# Patient Record
Sex: Male | Born: 1960 | Race: White | Hispanic: No | Marital: Single | Smoking: Never smoker
Health system: Southern US, Community
[De-identification: ages and names within clinical notes are randomized; demographics above are authoritative.]

## PROBLEM LIST (undated history)

## (undated) DIAGNOSIS — E079 Disorder of thyroid, unspecified: Secondary | ICD-10-CM

## (undated) DIAGNOSIS — E119 Type 2 diabetes mellitus without complications: Secondary | ICD-10-CM

---

## 2002-11-08 ENCOUNTER — Ambulatory Visit (HOSPITAL_BASED_OUTPATIENT_CLINIC_OR_DEPARTMENT_OTHER): Admission: RE | Admit: 2002-11-08 | Discharge: 2002-11-08 | Payer: Self-pay | Admitting: Internal Medicine

## 2004-01-10 ENCOUNTER — Inpatient Hospital Stay: Payer: Self-pay | Admitting: Internal Medicine

## 2004-01-10 ENCOUNTER — Other Ambulatory Visit: Payer: Self-pay

## 2004-05-15 ENCOUNTER — Ambulatory Visit: Payer: Self-pay | Admitting: Unknown Physician Specialty

## 2004-11-16 ENCOUNTER — Ambulatory Visit: Payer: Self-pay | Admitting: Internal Medicine

## 2007-08-29 ENCOUNTER — Ambulatory Visit: Payer: Self-pay | Admitting: Unknown Physician Specialty

## 2007-10-22 ENCOUNTER — Ambulatory Visit: Payer: Self-pay | Admitting: Internal Medicine

## 2011-03-02 ENCOUNTER — Ambulatory Visit: Payer: Self-pay | Admitting: Family Medicine

## 2011-09-08 ENCOUNTER — Emergency Department: Payer: Self-pay | Admitting: Emergency Medicine

## 2012-05-29 ENCOUNTER — Emergency Department: Payer: Self-pay | Admitting: Internal Medicine

## 2012-05-29 LAB — COMPREHENSIVE METABOLIC PANEL
Chloride: 103 mmol/L (ref 98–107)
Co2: 25 mmol/L (ref 21–32)
EGFR (African American): >60
EGFR (Non-African Amer.): >60
Glucose: 183 mg/dL — ABNORMAL HIGH (ref 65–99)
Osmolality: 276 (ref 275–301)
Potassium: 4 mmol/L (ref 3.5–5.1)
SGOT(AST): 19 U/L (ref 15–37)
SGPT (ALT): 39 U/L (ref 12–78)
Total Protein: 7.7 g/dL (ref 6.4–8.2)

## 2012-05-29 LAB — CBC
HCT: 46 % (ref 40.0–52.0)
MCH: 29.6 pg (ref 26.0–34.0)
MCHC: 35.1 g/dL (ref 32.0–36.0)
Platelet: 252 10*3/uL (ref 150–440)
RBC: 5.46 10*6/uL (ref 4.40–5.90)

## 2012-05-29 LAB — DRUG SCREEN, URINE
Amphetamines, Ur Screen: NEGATIVE (ref ?–1000)
MDMA (Ecstasy)Ur Screen: NEGATIVE (ref ?–500)
Methadone, Ur Screen: NEGATIVE (ref ?–300)
Opiate, Ur Screen: NEGATIVE (ref ?–300)

## 2012-08-26 ENCOUNTER — Encounter (HOSPITAL_COMMUNITY): Payer: Self-pay

## 2012-08-26 ENCOUNTER — Emergency Department (HOSPITAL_COMMUNITY)
Admission: EM | Admit: 2012-08-26 | Discharge: 2012-08-26 | Disposition: A | Discharge status: Home / Self Care | Payer: Commercial Managed Care - PPO | Attending: Emergency Medicine | Admitting: Emergency Medicine

## 2012-08-26 DIAGNOSIS — Z209 Contact with and (suspected) exposure to unspecified communicable disease: Secondary | ICD-10-CM

## 2012-08-26 DIAGNOSIS — Z23 Encounter for immunization: Secondary | ICD-10-CM | POA: Insufficient documentation

## 2012-08-26 DIAGNOSIS — E119 Type 2 diabetes mellitus without complications: Secondary | ICD-10-CM | POA: Insufficient documentation

## 2012-08-26 DIAGNOSIS — E079 Disorder of thyroid, unspecified: Secondary | ICD-10-CM | POA: Insufficient documentation

## 2012-08-26 HISTORY — DX: Disorder of thyroid, unspecified: E07.9

## 2012-08-26 HISTORY — DX: Type 2 diabetes mellitus without complications: E11.9

## 2012-08-26 MED ORDER — RABIES VACCINE, PCEC IM SUSR
1.0000 mL | Freq: Once | INTRAMUSCULAR | Status: AC
  Administered 2012-08-26: 1 mL via INTRAMUSCULAR
  Filled 2012-08-26: qty 1

## 2012-08-26 MED ORDER — RABIES IMMUNE GLOBULIN 150 UNIT/ML IM INJ
20.0000 [IU]/kg | INJECTION | Freq: Once | INTRAMUSCULAR | Status: AC
  Administered 2012-08-26: 1650 [IU] via INTRAMUSCULAR
  Filled 2012-08-26: qty 11

## 2012-08-26 NOTE — ED Notes (Signed)
Pt states he was scratched by a bat 3days ago, pt states had a fever x1 but only while at home, states his doctor sent him here to be checked for rabies. 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

## 2012-08-26 NOTE — ED Provider Notes (Signed)
  CSN: [id number]     Arrival date & time 08/26/12  1240 History     First MD Initiated Contact with Patient 08/26/12 1329     Chief Complaint  Patient presents with  . Rabies Injection    check for Rabies   (Consider location/radiation/quality/duration/timing/severity/associated sxs/prior Treatment) HPI Wesley Simon is a 51 y.o. male who presents to ED with complaint of being exposed to bats. States found bats in his addict. States got scratched on right arm. This happened 3 days ago. Unsure if got bit, but scratches did break skin. Also states he came in contact with bat urine and feces. States his PCP told him to come here for further treatment. Pt denies any symptoms.    Past Medical History  Diagnosis Date  . Thyroid disease   . Diabetes mellitus without complication    History reviewed. No pertinent past surgical history. No family history on file. History  Substance Use Topics  . Smoking status: Never Smoker   . Smokeless tobacco: Not on file  . Alcohol Use: No    Review of Systems  Constitutional: Negative for fever and chills.  HENT: Negative for neck pain and neck stiffness.   Respiratory: Negative.   Cardiovascular: Negative.   Musculoskeletal: Negative for myalgias and arthralgias.    Allergies  Other  Home Medications  No current outpatient prescriptions on file. BP 110/74  Pulse 100  Temp(Src) 98.1 F (36.7 C) (Oral)  Resp 16  Ht 5\' 6"  (1.676 m)  Wt 182 lb (82.555 kg)  BMI 29.39 kg/m2  SpO2 95% Physical Exam  Nursing note and vitals reviewed. Constitutional: He is oriented to person, place, and time. He appears well-developed and well-nourished. No distress.  HENT:  Head: Normocephalic.  Eyes: Conjunctivae are normal.  Neck: Neck supple.  Cardiovascular: Normal rate, regular rhythm and normal heart sounds.   Pulmonary/Chest: Effort normal and breath sounds normal. No respiratory distress. He has no wheezes. He has no rales.  Musculoskeletal: He  exhibits no edema.  Neurological: He is alert and oriented to person, place, and time.  Skin: Skin is warm and dry.  No lacerations over arm where pt reported bat scratches    ED Course   Procedures (including critical care time)  Labs Reviewed - No data to display No results found. 1. Exposure to bat without known bite     MDM  Pt with exposure to bats, urine, feces, and open skin to the right arm, which is now healed. Unsure if scratch or bite. Will start both immunoglobulin and vaccine. Instructions given for follow up.     Lottie Mussel, PA-C 08/26/12 2022

## 2012-08-27 NOTE — ED Provider Notes (Signed)
Medical screening examination/treatment/procedure(s) were performed by non-physician practitioner and as supervising physician I was immediately available for consultation/collaboration.   Enid Skeens, MD 08/27/12 [phone]

## 2012-08-30 ENCOUNTER — Emergency Department (INDEPENDENT_AMBULATORY_CARE_PROVIDER_SITE_OTHER)
Admission: EM | Admit: 2012-08-30 | Discharge: 2012-08-30 | Disposition: A | Discharge status: Home / Self Care | Payer: Commercial Managed Care - PPO | Source: Home / Self Care

## 2012-08-30 DIAGNOSIS — Z203 Contact with and (suspected) exposure to rabies: Secondary | ICD-10-CM

## 2012-08-30 MED ORDER — RABIES VACCINE, PCEC IM SUSR
INTRAMUSCULAR | Status: AC
  Filled 2012-08-30: qty 1

## 2012-08-30 MED ORDER — RABIES VACCINE, PCEC IM SUSR
1.0000 mL | Freq: Once | INTRAMUSCULAR | Status: AC
  Administered 2012-08-30: 1 mL via INTRAMUSCULAR

## 2012-08-30 NOTE — ED Notes (Signed)
Patient is here for day 3 rabies vaccination shot due to getting bite by a bat.  The injection is going in L Deltoid.

## 2012-08-30 NOTE — ED Notes (Signed)
States he received first shot in right deltoid.  The documentation from hospital states left deltoid.  Patient states that was not the site.  I will place shot in left deltoid today.

## 2012-09-04 ENCOUNTER — Emergency Department (INDEPENDENT_AMBULATORY_CARE_PROVIDER_SITE_OTHER)
Admission: EM | Admit: 2012-09-04 | Discharge: 2012-09-04 | Disposition: A | Discharge status: Home / Self Care | Payer: Commercial Managed Care - PPO | Source: Home / Self Care

## 2012-09-04 ENCOUNTER — Encounter (HOSPITAL_COMMUNITY): Payer: Self-pay | Admitting: *Deleted

## 2012-09-04 DIAGNOSIS — Z203 Contact with and (suspected) exposure to rabies: Secondary | ICD-10-CM

## 2012-09-04 MED ORDER — RABIES VACCINE, PCEC IM SUSR
INTRAMUSCULAR | Status: AC
  Filled 2012-09-04: qty 1

## 2012-09-04 MED ORDER — RABIES VACCINE, PCEC IM SUSR
1.0000 mL | Freq: Once | INTRAMUSCULAR | Status: AC
  Administered 2012-09-04: 1 mL via INTRAMUSCULAR

## 2012-09-04 NOTE — ED Notes (Signed)
Pt  Here  For  Next  Rabies   Vaccine       -  Pt  States  Everything  Going  Ok   No  Symptoms

## 2012-09-12 ENCOUNTER — Emergency Department (INDEPENDENT_AMBULATORY_CARE_PROVIDER_SITE_OTHER)
Admission: EM | Admit: 2012-09-12 | Discharge: 2012-09-12 | Disposition: A | Discharge status: Home / Self Care | Payer: Commercial Managed Care - PPO | Source: Home / Self Care

## 2012-09-12 ENCOUNTER — Encounter (HOSPITAL_COMMUNITY): Payer: Self-pay

## 2012-09-12 DIAGNOSIS — Z203 Contact with and (suspected) exposure to rabies: Secondary | ICD-10-CM

## 2012-09-12 MED ORDER — RABIES VACCINE, PCEC IM SUSR
1.0000 mL | Freq: Once | INTRAMUSCULAR | Status: AC
  Administered 2012-09-12: 1 mL via INTRAMUSCULAR

## 2012-09-12 MED ORDER — RABIES VACCINE, PCEC IM SUSR
INTRAMUSCULAR | Status: AC
  Filled 2012-09-12: qty 1

## 2012-09-12 NOTE — ED Notes (Signed)
Final shot in rabies series :NAD, no c/o

## 2012-10-16 ENCOUNTER — Emergency Department: Payer: Self-pay | Admitting: Emergency Medicine

## 2012-10-16 LAB — COMPREHENSIVE METABOLIC PANEL
Albumin: 4 g/dL (ref 3.4–5.0)
Alkaline Phosphatase: 75 U/L (ref 50–136)
Anion Gap: 4 — ABNORMAL LOW (ref 7–16)
BUN: 12 mg/dL (ref 7–18)
Bilirubin,Total: 0.5 mg/dL (ref 0.2–1.0)
Chloride: 104 mmol/L (ref 98–107)
Creatinine: 0.99 mg/dL (ref 0.60–1.30)
EGFR (African American): >60
Osmolality: 278 (ref 275–301)

## 2012-10-16 LAB — CBC
HCT: 47.7 % (ref 40.0–52.0)
HGB: 16.8 g/dL (ref 13.0–18.0)
MCHC: 35.2 g/dL (ref 32.0–36.0)
MCV: 85 fL (ref 80–100)
Platelet: 269 10*3/uL (ref 150–440)
RBC: 5.59 10*6/uL (ref 4.40–5.90)
RDW: 13.6 % (ref 11.5–14.5)

## 2012-10-20 ENCOUNTER — Ambulatory Visit: Payer: Self-pay | Admitting: Family Medicine

## 2013-01-27 ENCOUNTER — Ambulatory Visit: Payer: Self-pay | Admitting: Unknown Physician Specialty

## 2013-03-19 ENCOUNTER — Emergency Department: Payer: Self-pay | Admitting: Emergency Medicine

## 2013-03-19 LAB — CBC
HCT: 46.3 % (ref 40.0–52.0)
HGB: 15.9 g/dL (ref 13.0–18.0)
MCH: 29.3 pg (ref 26.0–34.0)
MCHC: 34.3 g/dL (ref 32.0–36.0)
MCV: 85 fL (ref 80–100)
Platelet: 208 10*3/uL (ref 150–440)
RBC: 5.42 10*6/uL (ref 4.40–5.90)
RDW: 14 % (ref 11.5–14.5)
WBC: 6.5 10*3/uL (ref 3.8–10.6)

## 2013-03-19 LAB — URINALYSIS, COMPLETE
BILIRUBIN, UR: NEGATIVE
Bacteria: NONE SEEN
Blood: NEGATIVE
Ketone: NEGATIVE
LEUKOCYTE ESTERASE: NEGATIVE
Nitrite: NEGATIVE
PROTEIN: NEGATIVE
Ph: 5 (ref 4.5–8.0)
RBC,UR: <1 /HPF (ref 0–5)
SQUAMOUS EPITHELIAL: NONE SEEN
Specific Gravity: 1.026 (ref 1.003–1.030)
WBC UR: <1 /HPF (ref 0–5)

## 2013-03-19 LAB — COMPREHENSIVE METABOLIC PANEL
ALT: 43 U/L (ref 12–78)
ANION GAP: 3 — AB (ref 7–16)
AST: 25 U/L (ref 15–37)
Albumin: 4 g/dL (ref 3.4–5.0)
Alkaline Phosphatase: 90 U/L
BILIRUBIN TOTAL: 0.7 mg/dL (ref 0.2–1.0)
BUN: 12 mg/dL (ref 7–18)
CO2: 28 mmol/L (ref 21–32)
CREATININE: 0.97 mg/dL (ref 0.60–1.30)
Calcium, Total: 9.8 mg/dL (ref 8.5–10.1)
Chloride: 98 mmol/L (ref 98–107)
EGFR (African American): >60
EGFR (Non-African Amer.): >60
GLUCOSE: 210 mg/dL — AB (ref 65–99)
Osmolality: 265 (ref 275–301)
POTASSIUM: 4.1 mmol/L (ref 3.5–5.1)
Sodium: 129 mmol/L — ABNORMAL LOW (ref 136–145)
Total Protein: 8 g/dL (ref 6.4–8.2)

## 2013-03-19 LAB — TROPONIN I: Troponin-I: <0.02 ng/mL

## 2013-03-19 LAB — RAPID INFLUENZA A&B ANTIGENS

## 2013-05-14 ENCOUNTER — Ambulatory Visit: Payer: Self-pay | Admitting: Unknown Physician Specialty

## 2013-05-15 LAB — PATHOLOGY REPORT

## 2013-08-03 ENCOUNTER — Emergency Department: Payer: Self-pay | Admitting: Emergency Medicine

## 2013-08-03 LAB — CBC
HCT: 48.9 % (ref 40.0–52.0)
HGB: 16.2 g/dL (ref 13.0–18.0)
MCH: 28.8 pg (ref 26.0–34.0)
MCHC: 33 g/dL (ref 32.0–36.0)
MCV: 87 fL (ref 80–100)
Platelet: 242 10*3/uL (ref 150–440)
RBC: 5.61 10*6/uL (ref 4.40–5.90)
RDW: 13.5 % (ref 11.5–14.5)
WBC: 6.3 10*3/uL (ref 3.8–10.6)

## 2013-08-03 LAB — COMPREHENSIVE METABOLIC PANEL
ALBUMIN: 3.9 g/dL (ref 3.4–5.0)
AST: 31 U/L (ref 15–37)
Alkaline Phosphatase: 78 U/L
Anion Gap: 8 (ref 7–16)
BILIRUBIN TOTAL: 0.6 mg/dL (ref 0.2–1.0)
BUN: 12 mg/dL (ref 7–18)
CALCIUM: 9 mg/dL (ref 8.5–10.1)
CO2: 25 mmol/L (ref 21–32)
Chloride: 103 mmol/L (ref 98–107)
Creatinine: 0.97 mg/dL (ref 0.60–1.30)
EGFR (Non-African Amer.): >60
Glucose: 222 mg/dL — ABNORMAL HIGH (ref 65–99)
OSMOLALITY: 279 (ref 275–301)
Potassium: 3.9 mmol/L (ref 3.5–5.1)
SGPT (ALT): 54 U/L (ref 12–78)
Sodium: 136 mmol/L (ref 136–145)
Total Protein: 7.8 g/dL (ref 6.4–8.2)

## 2013-08-03 LAB — LIPASE, BLOOD: Lipase: 158 U/L (ref 73–393)

## 2013-08-03 LAB — HEPATIC FUNCTION PANEL A (ARMC): BILIRUBIN DIRECT: 0.1 mg/dL (ref 0.00–0.20)

## 2013-12-03 ENCOUNTER — Ambulatory Visit: Payer: Self-pay | Admitting: Unknown Physician Specialty

## 2014-10-11 DIAGNOSIS — J301 Allergic rhinitis due to pollen: Secondary | ICD-10-CM | POA: Insufficient documentation

## 2014-12-03 ENCOUNTER — Emergency Department
Admission: EM | Admit: 2014-12-03 | Discharge: 2014-12-03 | Disposition: A | Discharge status: Home / Self Care | Payer: 59 | Attending: Emergency Medicine | Admitting: Emergency Medicine

## 2014-12-03 DIAGNOSIS — K529 Noninfective gastroenteritis and colitis, unspecified: Secondary | ICD-10-CM | POA: Diagnosis not present

## 2014-12-03 DIAGNOSIS — R112 Nausea with vomiting, unspecified: Secondary | ICD-10-CM | POA: Diagnosis present

## 2014-12-03 DIAGNOSIS — E119 Type 2 diabetes mellitus without complications: Secondary | ICD-10-CM | POA: Diagnosis not present

## 2014-12-03 LAB — BASIC METABOLIC PANEL
ANION GAP: 9 (ref 5–15)
BUN: 20 mg/dL (ref 6–20)
CALCIUM: 9.4 mg/dL (ref 8.9–10.3)
CO2: 25 mmol/L (ref 22–32)
CREATININE: 1.06 mg/dL (ref 0.61–1.24)
Chloride: 101 mmol/L (ref 101–111)
GLUCOSE: 174 mg/dL — AB (ref 65–99)
Potassium: 4 mmol/L (ref 3.5–5.1)
Sodium: 135 mmol/L (ref 135–145)

## 2014-12-03 LAB — CBC
HCT: 52.9 % — ABNORMAL HIGH (ref 40.0–52.0)
HEMOGLOBIN: 17.9 g/dL (ref 13.0–18.0)
MCH: 29.1 pg (ref 26.0–34.0)
MCHC: 33.7 g/dL (ref 32.0–36.0)
MCV: 86.2 fL (ref 80.0–100.0)
PLATELETS: 245 10*3/uL (ref 150–440)
RBC: 6.14 MIL/uL — AB (ref 4.40–5.90)
RDW: 14.1 % (ref 11.5–14.5)
WBC: 10.2 10*3/uL (ref 3.8–10.6)

## 2014-12-03 MED ORDER — ONDANSETRON HCL 4 MG PO TABS: 4.0000 mg | ORAL_TABLET | Freq: Every day | ORAL | Status: DC | PRN

## 2014-12-03 MED ORDER — SODIUM CHLORIDE 0.9 % IV SOLN
1000.0000 mL | Freq: Once | INTRAVENOUS | Status: AC
  Administered 2014-12-03: 1000 mL via INTRAVENOUS

## 2014-12-03 MED ORDER — ONDANSETRON HCL 4 MG/2ML IJ SOLN
INTRAMUSCULAR | Status: AC
  Administered 2014-12-03: 4 mg via INTRAVENOUS
  Filled 2014-12-03: qty 2

## 2014-12-03 MED ORDER — ONDANSETRON HCL 4 MG/2ML IJ SOLN: 4.0000 mg | Freq: Once | INTRAMUSCULAR | Status: AC

## 2014-12-03 NOTE — ED Notes (Signed)
Pt reports n/v/d x 2-3 days.  Pt reports seen at urgent care and sent to ED for dehydration and high blood pressure (159/90), pt states he normally has BP in 120s.  Pt NAD at this time.

## 2014-12-03 NOTE — ED Notes (Signed)
Pt brought to triage via wheelchair. Pt reports he was seen at urgent care and told to come to the ER because of his elevated blood pressure. Pt reports started 3 days ago wit n/v/d and also reports he had a fever two days ago of a little over 100. Pt has been feeling dizzy.

## 2014-12-03 NOTE — ED Provider Notes (Signed)
[facility] Emergency Department Provider Note  ____________________________________________  Time seen: On arrival  I have reviewed the triage vital signs and the nursing notes.   HISTORY  Chief Complaint Dizziness; Nausea; Emesis; and Diarrhea    HPI Wesley Simon is a 54 y.o. male who presents with complaints of nausea vomiting diarrhea for approximately 2 days. He was seen in urgent care and he was sent to the emergency department because he had an episode of dizziness. He also notes his blood pressure was higher than normal. He denies chest pain. He reports his dizziness has resolved. His primary complaint is nausea vomiting and diarrhea which has been copious. He denies abdominal pain     Past Medical History  Diagnosis Date  . Thyroid disease   . Diabetes mellitus without complication     There are no active problems to display for this patient.   No past surgical history on file.  No current outpatient prescriptions on file.  Allergies Other  No family history on file.  Social History Social History  Substance Use Topics  . Smoking status: Never Smoker   . Smokeless tobacco: Not on file  . Alcohol Use: No    Review of Systems  Constitutional: Negative for fever. Eyes: Negative for visual changes. ENT: Negative for sore throat Cardiovascular: Negative for chest pain. Respiratory: Negative for shortness of breath. Gastrointestinal positive for nausea vomiting and diarrhea Genitourinary: Negative for dysuria. Musculoskeletal: Negative for back pain. Skin: Negative for rash. Neurological: Negative for headaches or focal weakness Psychiatric no anxiety    ____________________________________________   PHYSICAL EXAM:  VITAL SIGNS: ED Triage Vitals  Enc Vitals Group     BP 12/03/14 2021 150/99 mmHg     Pulse Rate 12/03/14 2021 98     Resp 12/03/14 2021 18     Temp 12/03/14 2021 97.5 F (36.4 C)     Temp Source  12/03/14 2021 Oral     SpO2 12/03/14 2021 96 %     Weight 12/03/14 2021 185 lb (83.915 kg)     Height 12/03/14 2021 5\' 6"  (1.676 m)     Head Cir --      Peak Flow --      Pain Score 12/03/14 2022 3     Pain Loc --      Pain Edu? --      Excl. in [place]? --      Constitutional: Alert and oriented. Well appearing and in no distress. Eyes: Conjunctivae are normal.  ENT   Head: Normocephalic and atraumatic.   Mouth/Throat: Mucous membranes are moist. Cardiovascular: Normal rate, regular rhythm. Normal and symmetric distal pulses are present in all extremities. No murmurs, rubs, or gallops. Respiratory: Normal respiratory effort without tachypnea nor retractions. Breath sounds are clear and equal bilaterally.  Gastrointestinal: Soft and non-tender in all quadrants. No distention. There is no CVA tenderness. Genitourinary: deferred Musculoskeletal: Nontender with normal range of motion in all extremities. No lower extremity tenderness nor edema. Neurologic:  Normal speech and language. No gross focal neurologic deficits are appreciated. Skin:  Skin is warm, dry and intact. No rash noted. Psychiatric: Mood and affect are normal. Patient exhibits appropriate insight and judgment.  ____________________________________________    LABS (pertinent positives/negatives)  Labs Reviewed  BASIC METABOLIC PANEL - Abnormal; Notable for the following:    Glucose, Bld 174 (*)    All other components within normal limits  CBC - Abnormal; Notable for the following:    RBC  6.14 (*)    HCT 52.9 (*)    All other components within normal limits  URINALYSIS COMPLETEWITH MICROSCOPIC ([facility] ONLY)    ____________________________________________   EKG  ED ECG REPORT I, Lavonia Drafts, the attending physician, personally viewed and interpreted this ECG.  Date: 12/03/2014 EKG Time: 8:22 PM Rate: 99 Rhythm: normal sinus rhythm QRS Axis: normal Intervals: normal ST/T Wave abnormalities:  normal Conduction Disutrbances: none Narrative Interpretation: unremarkable   ____________________________________________    RADIOLOGY I have personally reviewed any xrays that were ordered on this patient: None  ____________________________________________   PROCEDURES  Procedure(s) performed: none  Critical Care performed: none  ____________________________________________   INITIAL IMPRESSION / ASSESSMENT AND PLAN / ED COURSE  Pertinent labs & imaging results that were available during my care of the patient were reviewed by me and considered in my medical decision making (see chart for details).  Patient overall well-appearing. He has no tenderness to palpation of his abdomen. His blood pressure is mildly elevated but is not surprising given that he is feeling ill. We will treat with IV fluids, Zofran and reevaluate.  Patient reports he is tolerating liquids at this time and feeling significant better   We will discharge the patient with Zofran Rx with instructions to return if any worsening of symptoms  ____________________________________________   FINAL CLINICAL IMPRESSION(S) / ED DIAGNOSES  Final diagnoses:  Gastroenteritis     Lavonia Drafts, MD 12/03/14 2249

## 2015-04-30 DIAGNOSIS — D132 Benign neoplasm of duodenum: Secondary | ICD-10-CM | POA: Insufficient documentation

## 2015-09-15 DIAGNOSIS — E786 Lipoprotein deficiency: Secondary | ICD-10-CM | POA: Insufficient documentation

## 2016-12-12 DIAGNOSIS — G4733 Obstructive sleep apnea (adult) (pediatric): Secondary | ICD-10-CM | POA: Insufficient documentation

## 2017-05-17 ENCOUNTER — Ambulatory Visit: Payer: Commercial Managed Care - PPO | Admitting: Dietician

## 2017-05-31 ENCOUNTER — Encounter: Payer: Self-pay | Admitting: Dietician

## 2017-05-31 ENCOUNTER — Encounter: Payer: Managed Care, Other (non HMO) | Attending: Internal Medicine | Admitting: Dietician

## 2017-05-31 VITALS — Ht 66.5 in | Wt 174.0 lb

## 2017-05-31 DIAGNOSIS — Z713 Dietary counseling and surveillance: Secondary | ICD-10-CM | POA: Diagnosis not present

## 2017-05-31 DIAGNOSIS — E119 Type 2 diabetes mellitus without complications: Secondary | ICD-10-CM | POA: Insufficient documentation

## 2017-05-31 NOTE — Patient Instructions (Signed)
   Remember that Carbohydrate foods (starches, fruits, sweets, milk) are the foods that will increase blood sugar if you eat too much at any one meal or snack.   Keep portions of starchy foods to 1 cup or less, which is the same size as a fisted hand.   Eat large portions of low-carb vegetables such as salads, green beans,  asparagus, cabbage, etc.  Plan to eat something every 3-5 hours during the day, either a meal or snack, to help prevent low blood sugars, especially when driving.   Keep up plenty of walking to prevent high blood sugar.

## 2017-05-31 NOTE — Progress Notes (Signed)
Medical Nutrition Therapy: Visit start time: 1330  end time: 7106 Assessment:  Diagnosis: Type 2 diabetes Past medical history: GERD, hyperlipidemia, kidney stones Psychosocial issues/ stress concerns: none  Preferred learning method:  . Auditory . Hands-on  Current weight: 174lbs (pt reported)  Height: 5'6.5" Medications, supplements: reconciled list in medical record  Progress and evaluation: Patient reports several year history of diabetes, previously well-controlled with HbA1C of 6.8%. He states he did take steroid medication temporarily for respiratory infection. 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   Physical activity: frequent walking per patient  Dietary Intake:  Usual eating pattern includes 2-3 meals and 2-3 snacks per day. Dining out frequency: multiple meals per week due to truck driving.  Breakfast: cereal-- lucky charms, wheaties, shredded wheat with sweetener, honey nut cheerios, captain crunch Snack: same as pm Lunch: usually out-- sandwich with ham and cheese, mayo, lettuce, onion on whole wheat occ at subway; occasional steak meal Snack: peanut butter crackers, occasionally cookie(s); often nuts walnuts, almonds, chestnuts, [place] nuts, pistachios, sunflower seeds Supper: lite meal at least 4hours before going to sleep; sandwich; soup Snack: none Beverages: water, coke or pepsi zero sugar, unsweetened tea, light juice 8oz or less with breakfast  Nutrition Care Education: Topics covered: diabetes Basic nutrition: basic food groups, appropriate nutrient balance, appropriate meal and snack schedule, general nutrition guidelines    Diabetes: appropriate meal and snack schedule, appropriate carb intake and balance, effects of different foods on BG control, plate method meal planning Hyperlipidemia: healthy and unhealthy fats   Nutritional Diagnosis:  [place]-2.2 Altered  nutrition-related laboratory As related to diabetes.  As evidenced by patient with HbA1C of 8.5%.  Intervention: Instruction as noted above.   Patient agrees to keep a food diary for evaluation at next visit.    Set goals with input from patient  Education Materials given:  . Plate Planner . Sample menus . Goals/ instructions  Learner/ who was taught:  . Patient   Level of understanding: . Partial understanding; needs review/ practice  Demonstrated degree of understanding via:   Teach back Learning barriers: . None  Willingness to learn/ readiness for change: . Acceptance, ready for change  Monitoring and Evaluation:  Dietary intake, exercise, BG control, and body weight      follow up: 06/12/17

## 2017-06-12 ENCOUNTER — Encounter: Payer: Self-pay | Admitting: Dietician

## 2017-06-12 ENCOUNTER — Encounter: Payer: Managed Care, Other (non HMO) | Admitting: Dietician

## 2017-06-12 VITALS — Ht 66.5 in | Wt 180.4 lb

## 2017-06-12 DIAGNOSIS — E119 Type 2 diabetes mellitus without complications: Secondary | ICD-10-CM

## 2017-06-12 DIAGNOSIS — Z713 Dietary counseling and surveillance: Secondary | ICD-10-CM | POA: Diagnosis not present

## 2017-06-12 NOTE — Patient Instructions (Addendum)
   When you check food labels, read the Total Carbohydrate grams, and keep to less than 60 grams for each meal. You can subtract the dietary fiber from the Total Carbohydrate. Remember that the numbers are based on the serving size listed at the top of the label.  Work to include at least one vegetable daily such as carrots, salad, green beans.   Keep working to increase exercise by adding more days or more time as you are able.

## 2017-06-12 NOTE — Progress Notes (Signed)
Medical Nutrition Therapy: Visit start time: 1130  end time: 1230  Assessment:  Diagnosis: Type 2 Diabetes Medical history changes: no changes Psychosocial issues/ stress concerns: none  Current weight: 180.4lbs Height: 5'6.5" Medications, supplement changes: reconciled list in medical record  Progress and evaluation: Patient reports some weight loss, weight listed at previous RD visit was 174, but was patient reported. Today's weight was measured. 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  Physical activity: increased walking up to 2 miles at a time, 2-3 times a week  Dietary Intake:  Usual eating pattern includes 2-3 meals and 1-2 snacks per day. Dining out frequency: 9-10 meals per week.  Breakfast: cereal with 2% milk with almonds Snack: sometimes crackers with peanut butter Lunch: sandwich, tacos, burger, crackers with cheese and/or peanut butter, nuts Snack: sometimes nuts or crackers with cheese or peanut butter, or popcorn Supper: chicken with rice, beef with rice, sandwich, burger, tacos, hot dog Snack: none or crackers with cheese or peanut butter, or nuts Beverages: diet soda, water  Nutrition Care Education: Topics covered: diabetes, weight control Basic nutrition: review of basic food groups, appropriate nutrient balance Weight control: reviewed strategies for portion control, role of exercise Diabetes: appropriate carb intake and balance  Nutritional Diagnosis:  [place]-2.2 Altered nutrition-related laboratory As related to diabetes.  As evidenced by patient with HbA1C of 8.5%.  Intervention: Discussion and review as noted above.   Commended patient for changes made thus far.    Set goals for further improving carb intake and control.  Education Materials given:  . Food lists/ Planning A Balanced Meal . Goals/ instructions   Learner/ who was taught:  . Patient   Level of  understanding: Marland Kitchen Verbalizes/ demonstrates competency   Demonstrated degree of understanding via:   Teach back Learning barriers: . None  Willingness to learn/ readiness for change: . Eager, change in progress  Monitoring and Evaluation:  Dietary intake, exercise, BG control, and body weight      follow up: 07/11/17

## 2017-06-21 ENCOUNTER — Ambulatory Visit: Payer: Self-pay

## 2017-06-21 ENCOUNTER — Ambulatory Visit (INDEPENDENT_AMBULATORY_CARE_PROVIDER_SITE_OTHER): Payer: Managed Care, Other (non HMO) | Admitting: Urology

## 2017-06-21 ENCOUNTER — Encounter: Payer: Self-pay | Admitting: Urology

## 2017-06-21 VITALS — BP 138/80 | HR 97 | Ht 66.0 in | Wt 184.0 lb

## 2017-06-21 DIAGNOSIS — R350 Frequency of micturition: Secondary | ICD-10-CM

## 2017-06-21 DIAGNOSIS — R310 Gross hematuria: Secondary | ICD-10-CM

## 2017-06-21 LAB — URINALYSIS, COMPLETE
BILIRUBIN UA: NEGATIVE
KETONES UA: NEGATIVE
LEUKOCYTES UA: NEGATIVE
NITRITE UA: NEGATIVE
Protein, UA: NEGATIVE
RBC UA: NEGATIVE
SPEC GRAV UA: 1.02 (ref 1.005–1.030)
UUROB: 1 mg/dL (ref 0.2–1.0)
pH, UA: 5 (ref 5.0–7.5)

## 2017-06-21 LAB — MICROSCOPIC EXAMINATION
EPITHELIAL CELLS (NON RENAL): NONE SEEN /HPF (ref 0–10)
RBC, UA: NONE SEEN /hpf (ref 0–2)
WBC UA: NONE SEEN /HPF (ref 0–5)

## 2017-06-21 NOTE — Progress Notes (Signed)
06/21/2017 3:22 PM   Wesley Simon Jul 10, 1960 [id number]  Referring provider: Tracie Harrier, MD [address] [facility] [place], [place] [address]  Chief Complaint  Patient presents with  . Urinary Frequency    HPI: 57 year old male with a prior history of stone disease.  He has seen Dr. Yves Dill approximately 2 years ago.  He states he was recently seen at [facility] clinic walk-in for blood in the urine and was passing stones.  He apparently had a CT scan done at the outpatient facility on [address] however there is no record in epic.  He is presently asymptomatic and denies flank pain or hematuria.  He did have gross hematuria however states this was in conjunction with passing a stone.    He has baseline urinary frequency, urgency, intermittent urinary stream but states the symptoms are not bothersome enough that he desires medication or treatment.  He also complains of decreased libido.   PMH: Past Medical History:  Diagnosis Date  . Diabetes mellitus without complication ([place])   . Thyroid disease     Surgical History: No past surgical history on file.  Home Medications:  Allergies as of 06/21/2017      Reactions   Other    Pt states "all antibotics"    Tramadol-acetaminophen Rash      Medication List        Accurate as of 06/21/17  3:22 PM. Always use your most recent med list.          atorvastatin 10 MG tablet Commonly known as:  LIPITOR Take by mouth.   glipiZIDE 5 MG 24 hr tablet Commonly known as:  GLUCOTROL XL   glucose blood test strip Use once daily. Use as instructed.   ibuprofen 800 MG tablet Commonly known as:  ADVIL,MOTRIN Take by mouth.   loratadine 10 MG tablet Commonly known as:  CLARITIN take 1 tablet by mouth once daily for allergies   metFORMIN 500 MG 24 hr tablet Commonly known as:  GLUCOPHAGE-XR 2 (two) times daily.       Allergies:  Allergies  Allergen Reactions  . Other     Pt states "all  antibotics"   . Tramadol-Acetaminophen Rash    Family History: No family history on file.  Social History:  reports that he has never smoked. He has never used smokeless tobacco. He reports that he does not drink alcohol or use drugs.  ROS: UROLOGY Frequent Urination?: Yes Hard to postpone urination?: No Burning/pain with urination?: Yes Get up at night to urinate?: Yes Leakage of urine?: Yes Urine stream starts and stops?: Yes Trouble starting stream?: Yes Do you have to strain to urinate?: No Blood in urine?: Yes Urinary tract infection?: No Sexually transmitted disease?: No Injury to kidneys or bladder?: No Painful intercourse?: No Weak stream?: No Erection problems?: Yes Penile pain?: Yes  Gastrointestinal Nausea?: No Vomiting?: No Indigestion/heartburn?: No Diarrhea?: No Constipation?: No  Constitutional Fever: No Night sweats?: No Weight loss?: Yes Fatigue?: Yes  Skin Skin rash/lesions?: No Itching?: No  Eyes Blurred vision?: No Double vision?: No  Ears/Nose/Throat Sore throat?: No Sinus problems?: Yes  Hematologic/Lymphatic Swollen glands?: No Easy bruising?: No  Cardiovascular Leg swelling?: No Chest pain?: No  Respiratory Cough?: No Shortness of breath?: No  Endocrine Excessive thirst?: Yes  Musculoskeletal Back pain?: No Joint pain?: No  Neurological Headaches?: No Dizziness?: No  Psychologic Depression?: No Anxiety?: Yes  Physical Exam: BP 138/80   Pulse 97   Ht 5'  6" (1.676 m)   Wt 184 lb (83.5 kg)   BMI 29.70 kg/m   Constitutional:  Alert and oriented, No acute distress. HEENT: [place] AT, moist mucus membranes.  Trachea midline, no masses. Cardiovascular: No clubbing, cyanosis, or edema. Respiratory: Normal respiratory effort, no increased work of breathing. GI: Abdomen is soft, nontender, nondistended, no abdominal masses GU: No CVA tenderness Lymph: No cervical or inguinal lymphadenopathy. Skin: No rashes,  bruises or suspicious lesions. Neurologic: Grossly intact, no focal deficits, moving all 4 extremities. Psychiatric: Normal mood and affect.   Assessment & Plan:   57 year old male with a history of hematuria and stone disease with an apparent recent CT scan within last 3 months.  Will need to see if a scan was indeed performed at [facility] or elsewhere.  Since he had gross hematuria I did recommend cystoscopy.  Will need to check a a.m. testosterone level for his decreased libido.   Abbie Sons, [place] [address] [place],  [address] [phone]

## 2017-06-25 ENCOUNTER — Encounter: Payer: Self-pay | Admitting: Urology

## 2017-07-11 ENCOUNTER — Encounter: Payer: Self-pay | Admitting: Dietician

## 2017-07-11 ENCOUNTER — Encounter: Payer: Managed Care, Other (non HMO) | Attending: Internal Medicine | Admitting: Dietician

## 2017-07-11 VITALS — Ht 66.5 in | Wt 184.6 lb

## 2017-07-11 DIAGNOSIS — Z713 Dietary counseling and surveillance: Secondary | ICD-10-CM | POA: Insufficient documentation

## 2017-07-11 DIAGNOSIS — E119 Type 2 diabetes mellitus without complications: Secondary | ICD-10-CM | POA: Diagnosis not present

## 2017-07-11 NOTE — Progress Notes (Signed)
Medical Nutrition Therapy: Visit start time: 1100  end time: 1200  Assessment:  Diagnosis: Type 2 Diabetes Medical history changes: patient states he has bronchitis (has not yet seen a physician) Psychosocial issues/ stress concerns: none  Current weight: 184.6lbs Height: 5'6.5" Medications, supplement changes: reconciled list in medical record  Progress and evaluation: Weight gain of 4.2lbs since previous visit on 06/12/17. Patient reports he has not been following his diet as closely recently; but he states he has started eating carrots and asparagus, also green beans. He reports erratic schedule for eating and sleeping recently due to work, ie arrived home at 5am this morning. 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 Patient did not bring food diary to this visit, which was a goal set at previous visit.    Physical activity: walking 2-3 times a week, sporadic recently due to work hours.   Dietary Intake:  Usual eating pattern includes 2 meals and 2-3 snacks per day. Dining out frequency: 6 meals per week.  Breakfast: rice krispies or wheaties with splenda (was eating sugar sweetened cereals) Snack: none or crackers with cheese or pb Lunch: sometimes skips due to lack of time; or snack  Snack: crackers with cheese or peanut butter Supper: time varies; eats 2 meals daily; smaller portions of rice with chicken or beef; or sandwich/ burger Snack: none Beverages: water, unsweetened tea, diet soda. Light juice  Nutrition Care Education: Topics covered: diabetes, weight control Basic nutrition: appropriate meal and snack schedule    Weight control: importance of low fat and low sugar choices, appropriate food portions, role of exercise in weight control and BG control Diabetes:  goals for BGs, appropriate meal and snack schedule, appropriate carb intake and balance, role of protein in BG control, effects of sleep disruption/ erratic schedule on BGs.    Nutritional Diagnosis:  [place]-2.2 Altered  nutrition-related laboratory As related to Type 2 diabetes.  As evidenced by patient with HbA1C of 7.5%. [place]-3.3 Overweight/obesity As related to inactivity and occasional excess calories.  As evidenced by patient report, with BMI of 29.  Intervention: Instruction as noted above.   Updated goals with direction from patient.    Patient would like one more follow-up visit.   Education Materials given:  Marland Kitchen Goals/ instructions   Learner/ who was taught:  . Patient   Level of understanding: Mar[place] Kitchen Verbalizes/ demonstrates competency   Demonstrated degree of understanding via:   Teach back Learning barriers: . None  Willingness to learn/ readiness for change: . Eager, change in progress  Monitoring and Evaluation:  Dietary intake, exercise, BG control, and body weight      follow up: 08/09/17

## 2017-07-11 NOTE — Patient Instructions (Signed)
   Keep working to eat plenty of low-carb vegetables, like salads, carrots, green beans, asparagus.   When eating a salad, try keeping the dressing on the side and dip fork in the dressing, then take a bite of salad.   Continue to include as much exercise as possible by walking often or doing other activities.   Try eating fruit for at least one snack daily with up to 1/4 cup (1/2 of a handful)

## 2017-07-24 ENCOUNTER — Other Ambulatory Visit: Payer: Managed Care, Other (non HMO) | Admitting: Urology

## 2017-07-24 ENCOUNTER — Encounter: Payer: Self-pay | Admitting: Urology

## 2017-07-24 ENCOUNTER — Ambulatory Visit: Payer: Managed Care, Other (non HMO) | Admitting: Urology

## 2017-07-24 VITALS — BP 136/83 | HR 80 | Ht 66.0 in | Wt 175.0 lb

## 2017-07-24 DIAGNOSIS — R310 Gross hematuria: Secondary | ICD-10-CM

## 2017-07-24 DIAGNOSIS — R350 Frequency of micturition: Secondary | ICD-10-CM

## 2017-07-24 DIAGNOSIS — R6882 Decreased libido: Secondary | ICD-10-CM

## 2017-07-24 MED ORDER — CIPROFLOXACIN HCL 500 MG PO TABS: 500.0000 mg | ORAL_TABLET | Freq: Once | ORAL | Status: DC

## 2017-07-24 MED ORDER — LIDOCAINE HCL URETHRAL/MUCOSAL 2 % EX GEL: 1.0000 "application " | Freq: Once | CUTANEOUS | Status: DC

## 2017-07-24 NOTE — Progress Notes (Signed)
   07/24/17  CC:  Chief Complaint  Patient presents with  . Cysto    HPI: Refer to my prior note of 06/21/2017.  He denies recurrent hematuria or passing stones.  There is no record of a CT being performed within the [facility] system.  Blood pressure 136/83, pulse 80, height 5\' 6"  (1.676 m), weight 175 lb (79.4 kg). NED. A&Ox3.   No respiratory distress   Abd soft, NT, ND Normal phallus with bilateral descended testicles  Cystoscopy Procedure Note  Patient identification was confirmed, informed consent was obtained, and patient was prepped using Betadine solution.  Lidocaine jelly was administered per urethral meatus.    Preoperative abx where received prior to procedure.     Pre-Procedure: - Inspection reveals a normal caliber ureteral meatus.  Procedure: The flexible cystoscope was introduced without difficulty - No urethral strictures/lesions are present. - Touching lateral lobes prostate  - Mild bladder neck elevation - Bilateral ureteral orifices identified with clear efflux - Bladder mucosa  reveals no ulcers, tumors, or lesions - No bladder stones - No trabeculation  Retroflexion shows no significant abnormalities   Post-Procedure: - Patient tolerated the procedure well  Assessment/ Plan: Will schedule a renal ultrasound.  If he has recurrent gross hematuria would recommend a CT urogram.  He also is complaining of decreased libido and will have him come back for a morning testosterone level and LH.

## 2017-07-25 LAB — URINALYSIS, COMPLETE
BILIRUBIN UA: NEGATIVE
Ketones, UA: NEGATIVE
LEUKOCYTES UA: NEGATIVE
Nitrite, UA: NEGATIVE
PH UA: 5.5 (ref 5.0–7.5)
RBC, UA: NEGATIVE
Specific Gravity, UA: 1.025 (ref 1.005–1.030)
Urobilinogen, Ur: 0.2 mg/dL (ref 0.2–1.0)

## 2017-07-25 LAB — MICROSCOPIC EXAMINATION
BACTERIA UA: NONE SEEN
Epithelial Cells (non renal): NONE SEEN /hpf (ref 0–10)
WBC, UA: NONE SEEN /hpf (ref 0–5)

## 2017-08-09 ENCOUNTER — Ambulatory Visit: Payer: Managed Care, Other (non HMO)

## 2017-08-09 ENCOUNTER — Other Ambulatory Visit: Payer: Managed Care, Other (non HMO)

## 2017-08-09 ENCOUNTER — Encounter: Payer: Self-pay | Admitting: Dietician

## 2017-08-09 ENCOUNTER — Encounter: Payer: Managed Care, Other (non HMO) | Attending: Internal Medicine | Admitting: Dietician

## 2017-08-09 VITALS — Ht 66.5 in | Wt 184.3 lb

## 2017-08-09 DIAGNOSIS — E119 Type 2 diabetes mellitus without complications: Secondary | ICD-10-CM | POA: Diagnosis not present

## 2017-08-09 DIAGNOSIS — Z713 Dietary counseling and surveillance: Secondary | ICD-10-CM | POA: Diagnosis present

## 2017-08-09 DIAGNOSIS — R6882 Decreased libido: Secondary | ICD-10-CM

## 2017-08-09 NOTE — Patient Instructions (Signed)
   If blood sugar drops too low, test to confirm, and if under 70, take 3-4 glucose tablets or 4oz juice or regular soda. Wait 15 minutes for blood sugar to start coming up, then eat a snack or a meal within 30 minutes.   Keep up some exercise or other activity as often as possible to help burn calories.   Great job working on food portions and controlling carb intake, keep it up!

## 2017-08-09 NOTE — Progress Notes (Signed)
Medical Nutrition Therapy: Visit start time: 1000  end time: 1100  Assessment:  Diagnosis: Type 2 diabetes Medical history changes: no changes Psychosocial issues/ stress concerns: none  Current weight: 184.3lbs Height: 5'6.5" Medications, supplement changes: reconciled list in medical record  Progress and evaluation: Patient reports difficulty with fluctuating work hours and shifts, some 3rd shift, 1st shift, occasional 2nd.  He feels he has lost some weight by eating smaller portions, fewer truck stop meals, orders senior citizen plate when able. He reports decrease in blood sugars, has had occasional low BGs, in 50s per patient.  Physical activity: walking 30-60 minutes when able on days off  Dietary Intake:  Usual eating pattern includes 2 meals and 2 snacks per day. Dining out frequency: 6 meals per week.  Breakfast: wheaties or rice krispies, occ captain crunch Snack: crackers with peanut butter, or cheese Lunch: 6-inch sub with [place], ham or roast beef, sometimes chips  Snack: peanuts, cashews, pistachios, sunflower seeds.  Supper: chicken noodle soup, crackers with peanut butter Snack: sometimes ice cream in afternoon or evening Beverages: water. unsweet tea, diet soda  Nutrition Care Education: Topics covered: diabetes, weight control   Weight control: reviewed food portions, importance of lowfat and low sugar food choices' reviewed role of exercise Diabetes: treatment for hypoglycemia   Nutritional Diagnosis:  [place]-2.2 Altered nutrition-related laboratory As related to Type 2 diabetes.  As evidenced by patient with HbA1C of 7.5%. [place]-3.3 Overweight/obesity As related to inactivity and occasional excess calories, erratic sleep pattern due to work.  As evidenced by patient with BMI of 29.  Intervention: Discussion as noted above.   Updated goals with input from patient; he will continue with current eating pattern and continue to add activity as able.    Patient requested one  more follow-up visit.  Education Materials given:  Marland Kitchen Goals/ instructions   Learner/ who was taught:  . Patient   Level of understanding: Marland Kitchen Verbalizes/ demonstrates competency   Demonstrated degree of understanding via:   Teach back Learning barriers: . None  Willingness to learn/ readiness for change: . Eager, change in progress  Monitoring and Evaluation:  Dietary intake, exercise, BG control, and body weight      follow up: 09/20/17

## 2017-08-10 LAB — FSH/LH
FSH: 5.6 m[IU]/mL (ref 1.5–12.4)
LH: 6.6 m[IU]/mL (ref 1.7–8.6)

## 2017-08-10 LAB — TESTOSTERONE: Testosterone: 386 ng/dL (ref 264–916)

## 2017-08-12 ENCOUNTER — Telehealth: Payer: Self-pay

## 2017-08-12 NOTE — Telephone Encounter (Signed)
Pt notified on vmail

## 2017-08-12 NOTE — Telephone Encounter (Signed)
-----   Message from Abbie Sons, MD sent at 08/11/2017 10:40 AM EDT ----- Testosterone level was normal

## 2017-08-16 ENCOUNTER — Ambulatory Visit: Payer: Managed Care, Other (non HMO)

## 2017-08-28 ENCOUNTER — Other Ambulatory Visit: Payer: Self-pay

## 2017-09-20 ENCOUNTER — Ambulatory Visit
Admission: RE | Admit: 2017-09-20 | Discharge: 2017-09-20 | Disposition: A | Discharge status: Home / Self Care | Payer: Managed Care, Other (non HMO) | Source: Ambulatory Visit | Attending: Urology | Admitting: Urology

## 2017-09-20 ENCOUNTER — Encounter: Payer: Managed Care, Other (non HMO) | Attending: Internal Medicine | Admitting: Dietician

## 2017-09-20 VITALS — Ht 66.5 in | Wt 181.1 lb

## 2017-09-20 DIAGNOSIS — N329 Bladder disorder, unspecified: Secondary | ICD-10-CM | POA: Insufficient documentation

## 2017-09-20 DIAGNOSIS — R310 Gross hematuria: Secondary | ICD-10-CM

## 2017-09-20 DIAGNOSIS — E119 Type 2 diabetes mellitus without complications: Secondary | ICD-10-CM | POA: Diagnosis not present

## 2017-09-20 DIAGNOSIS — R319 Hematuria, unspecified: Secondary | ICD-10-CM | POA: Insufficient documentation

## 2017-09-20 DIAGNOSIS — Z713 Dietary counseling and surveillance: Secondary | ICD-10-CM | POA: Diagnosis not present

## 2017-09-20 NOTE — Progress Notes (Signed)
Medical Nutrition Therapy: Visit start time: 1140  end time: 1210  Assessment:  Diagnosis: type 2 diabetes Medical history changes: none Psychosocial issues/ stress concerns: none  Current weight: 181.1lbs Height: 5'6.5" Medications, supplement changes: reconciled list in medical record.  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  Tests BGs once a day, FBGs ranging 70-90s typically. Had a 55 reading during the night, subsequently patient has stopped Glipizide per MD instructions. Highest reading after eating has been 142.   Physical activity: walking 30-60 minutes on days off. Patient states he has walked more in the past 3 months than he has in a very long time.   Dietary Intake:  Usual eating pattern includes 2-3 meals and 2 snacks per day. Dining out frequency: 6 meals per week.  Breakfast: wheaties or rice krispies, rarely oatmeal with cinnamon and brown sugar.  Snack: small portion crackers with cheese or peanut butter Lunch: sandwich or sub; sometimes skips (does eat snacks if no meal) Snack: small portion nuts Supper: chicken noodle soup, crackers with peanut butter, salads Snack: ice cream only rarely Beverages: water, unsweet tea, occasional diet soda  Nutrition Care Education: Topics covered: diabetes, weight control     Weight control: reviewed progress since previous visit; reviewed importance of regular exercise; appropriate food portions.  Diabetes:  goals for BGs   Nutritional Diagnosis:  [place]-2.2 Altered nutrition-related laboratory As related to diabetes.  As evidenced by patient with recent HbA1C of 7.5%. [place]-3.3 Overweight/obesity As related to history of excess calories and limited activity.  As evidenced by patient with current BMI of 28.7, and patient report of diet history.  Intervention: Discussion as noted above.   Goal is for patient to continue  with current eating pattern and regular exercise.    No further follow-up scheduled at this time; patient will call later if needed.   Education Materials given:  Marland Kitchen Goals/ instructions   Learner/ who was taught:  . Patient    Level of understanding: Marland Kitchen Verbalizes/ demonstrates competency   Demonstrated degree of understanding via:   Teach back Learning barriers: . None  Willingness to learn/ readiness for change: . Eager, change in progress   Monitoring and Evaluation:  Dietary intake, exercise, BG control, and body weight      follow up: prn

## 2017-09-20 NOTE — Patient Instructions (Signed)
   Keep up with your healthy eating pattern and regular exercise, great job!  Call Pam with any questions or concerns.

## 2017-09-23 ENCOUNTER — Telehealth: Payer: Self-pay | Admitting: Family Medicine

## 2017-09-23 NOTE — Telephone Encounter (Signed)
-----   Message from Abbie Sons, MD sent at 09/21/2017  9:14 PM EDT ----- Renal US was normal

## 2017-09-23 NOTE — Telephone Encounter (Signed)
Patient notified, he is wanting to know when he will need to follow up?

## 2017-09-24 NOTE — Telephone Encounter (Signed)
Follow-up Shannon in 6 months for recheck/repeat UA

## 2017-09-26 NOTE — Telephone Encounter (Signed)
Patient is scheduled in September for Elevated PSA. He was referred from PCP

## 2017-10-03 ENCOUNTER — Ambulatory Visit: Payer: Managed Care, Other (non HMO) | Admitting: Urology

## 2017-10-11 ENCOUNTER — Ambulatory Visit: Payer: Managed Care, Other (non HMO) | Admitting: Urology

## 2017-10-18 ENCOUNTER — Ambulatory Visit: Payer: Managed Care, Other (non HMO) | Admitting: Urology

## 2017-10-25 ENCOUNTER — Ambulatory Visit: Payer: Managed Care, Other (non HMO) | Admitting: Urology

## 2017-10-25 ENCOUNTER — Encounter: Payer: Self-pay | Admitting: Urology

## 2017-10-25 VITALS — BP 124/75 | HR 82 | Ht 66.0 in | Wt 180.6 lb

## 2017-10-25 DIAGNOSIS — R972 Elevated prostate specific antigen [PSA]: Secondary | ICD-10-CM

## 2017-10-25 DIAGNOSIS — R6882 Decreased libido: Secondary | ICD-10-CM

## 2017-10-25 MED ORDER — TAMSULOSIN HCL 0.4 MG PO CAPS: 0.4000 mg | ORAL_CAPSULE | Freq: Every day | ORAL | 1 refills | Status: DC

## 2017-10-25 NOTE — Progress Notes (Signed)
10/25/2017 8:46 AM   Wesley Simon 10-11-1960 [id number]  Referring provider: Tracie Harrier, MD [address] [facility] [place], [place] [address]  Chief Complaint  Patient presents with  . Elevated PSA    HPI: 57 year old male presents for evaluation of an elevated PSA.  He has previously been evaluated for hematuria and was last seen June 2019 for a cystoscopy which showed prostate enlargement.  He was also complaining of decreased libido and a testosterone level was normal.  He does have lower urinary tract symptoms including urinary frequency, nocturia x2, urgency, intermittent urinary stream and decreased force and caliber of his stream.  A PSA performed with his PCP on 08/09/2017 was 8.09.  His PSA last 2 years has been in the low to upper 1 range.  His urinalysis was normal   PMH: Past Medical History:  Diagnosis Date  . Diabetes mellitus without complication ([place])   . Thyroid disease     Surgical History: No past surgical history on file.  Home Medications:  Allergies as of 10/25/2017      Reactions   Other    Pt states "all antibotics"    Tramadol-acetaminophen Rash      Medication List        Accurate as of 10/25/17  8:46 AM. Always use your most recent med list.          atorvastatin 10 MG tablet Commonly known as:  LIPITOR Take by mouth.   azithromycin 250 MG tablet Commonly known as:  ZITHROMAX take 2 tablets by mouth today then take 1 tablet DAILY FOR 4 DAYS   benzonatate 100 MG capsule Commonly known as:  TESSALON take 1 capsule by mouth three times a day if needed for cough   glipiZIDE 5 MG 24 hr tablet Commonly known as:  GLUCOTROL XL   glucose blood test strip Use once daily. Use as instructed.   guaiFENesin 600 MG 12 hr tablet Commonly known as:  MUCINEX take 1 tablet by mouth every 12 hours if needed   ibuprofen 800 MG tablet Commonly known as:  ADVIL,MOTRIN Take by mouth.   metFORMIN 500 MG 24 hr  tablet Commonly known as:  GLUCOPHAGE-XR 2 (two) times daily.       Allergies:  Allergies  Allergen Reactions  . Other     Pt states "all antibotics"   . Tramadol-Acetaminophen Rash    Family History: No family history on file.  Social History:  reports that he has never smoked. He has never used smokeless tobacco. He reports that he does not drink alcohol or use drugs.  ROS: UROLOGY Frequent Urination?: Yes Hard to postpone urination?: No Burning/pain with urination?: No Get up at night to urinate?: Yes Leakage of urine?: Yes Urine stream starts and stops?: No Trouble starting stream?: No Do you have to strain to urinate?: No Blood in urine?: Yes Urinary tract infection?: No Sexually transmitted disease?: No Injury to kidneys or bladder?: No Painful intercourse?: No Weak stream?: Yes Erection problems?: Yes Penile pain?: No  Gastrointestinal Nausea?: No Vomiting?: No Indigestion/heartburn?: No Diarrhea?: Yes Constipation?: No  Constitutional Fever: No Night sweats?: Yes Weight loss?: Yes Fatigue?: No  Skin Skin rash/lesions?: No Itching?: No  Eyes Blurred vision?: Yes Double vision?: No  Ears/Nose/Throat Sore throat?: No Sinus problems?: No  Hematologic/Lymphatic Swollen glands?: No Easy bruising?: No  Cardiovascular Leg swelling?: No Chest pain?: No  Respiratory Cough?: No Shortness of breath?: No  Endocrine Excessive thirst?: No  Musculoskeletal  Back pain?: No Joint pain?: No  Neurological Headaches?: No Dizziness?: No  Psychologic Depression?: No Anxiety?: No  Physical Exam: BP 124/75 (BP Location: Left Arm, Patient Position: Sitting, Cuff Size: Normal)   Pulse 82   Ht 5\' 6"  (1.676 m)   Wt 180 lb 9.6 oz (81.9 kg)   BMI 29.15 kg/m   Constitutional:  Alert and oriented, No acute distress. HEENT: [place] AT, moist mucus membranes.  Trachea midline, no masses. Cardiovascular: No clubbing, cyanosis, or edema. Respiratory:  Normal respiratory effort, no increased work of breathing. GI: Abdomen is soft, nontender, nondistended, no abdominal masses GU: No CVA tenderness.  Prostate 35 g, smooth without nodules Lymph: No cervical or inguinal lymphadenopathy. Skin: No rashes, bruises or suspicious lesions. Neurologic: Grossly intact, no focal deficits, moving all 4 extremities. Psychiatric: Normal mood and affect.   Assessment & Plan:   57 year old male with an elevated PSA.  His most recent PSA is significantly elevated above baseline and most likely represents inflammation.  Will treat with a 30-day course of tamsulosin and repeat his PSA in 1 month.  If it remains elevated would recommend prostate biopsy.  Will recheck a total and add a free testosterone with his next lab draw.   Abbie Sons, [place] [address] [place], [place] [address] [phone]

## 2017-11-29 ENCOUNTER — Ambulatory Visit (INDEPENDENT_AMBULATORY_CARE_PROVIDER_SITE_OTHER): Payer: Self-pay | Admitting: Urology

## 2017-11-29 ENCOUNTER — Other Ambulatory Visit: Payer: Managed Care, Other (non HMO)

## 2017-11-29 ENCOUNTER — Encounter: Payer: Self-pay | Admitting: Urology

## 2017-11-29 VITALS — BP 119/67 | HR 82 | Ht 66.0 in | Wt 183.4 lb

## 2017-11-29 DIAGNOSIS — E119 Type 2 diabetes mellitus without complications: Secondary | ICD-10-CM | POA: Insufficient documentation

## 2017-11-29 DIAGNOSIS — N200 Calculus of kidney: Secondary | ICD-10-CM | POA: Insufficient documentation

## 2017-11-29 DIAGNOSIS — R972 Elevated prostate specific antigen [PSA]: Secondary | ICD-10-CM

## 2017-11-29 DIAGNOSIS — K219 Gastro-esophageal reflux disease without esophagitis: Secondary | ICD-10-CM | POA: Insufficient documentation

## 2017-11-29 DIAGNOSIS — R350 Frequency of micturition: Secondary | ICD-10-CM

## 2017-11-29 MED ORDER — ALFUZOSIN HCL ER 10 MG PO TB24: 10.0000 mg | ORAL_TABLET | Freq: Every day | ORAL | 1 refills | Status: DC

## 2017-11-29 NOTE — Progress Notes (Signed)
11/29/2017 9:44 AM   Wesley Simon  [birth year] [id number]  Referring provider: Tracie Harrier, MD [address] [facility] [place], [place] [address]  Chief Complaint  Patient presents with  . Elevated PSA    HPI: Patient was seen 9/27 for an elevated PSA, lower urinary tract symptoms and low libido.  He was given a trial of tamsulosin and was to have a repeat PSA and free testosterone.  He states he had extreme tiredness with the tamsulosin and had to discontinue the medication after few days.  He did not have his blood drawn and states he will have to wait until after the first of the year because his insurance does not take effect until then.  He was interested in an alternative medication for his lower urinary tract symptoms.   PMH: Past Medical History:  Diagnosis Date  . Diabetes mellitus without complication ([place])   . Thyroid disease     Surgical History: No past surgical history on file.  Home Medications:  Allergies as of 11/29/2017      Reactions   Other    Pt states "all antibotics"    Tramadol-acetaminophen Rash      Medication List        Accurate as of 11/29/17  9:44 AM. Always use your most recent med list.          atorvastatin 10 MG tablet Commonly known as:  LIPITOR Take by mouth.   glipiZIDE 5 MG 24 hr tablet Commonly known as:  GLUCOTROL XL   glucose blood test strip Use once daily. Use as instructed.   ibuprofen 800 MG tablet Commonly known as:  ADVIL,MOTRIN Take by mouth.   metFORMIN 500 MG 24 hr tablet Commonly known as:  GLUCOPHAGE-XR 2 (two) times daily.   tamsulosin 0.4 MG Caps capsule Commonly known as:  FLOMAX Take 1 capsule (0.4 mg total) by mouth daily.       Allergies:  Allergies  Allergen Reactions  . Other     Pt states "all antibotics"   . Tramadol-Acetaminophen Rash    Family History: No family history on file.  Social History:  reports that he has never smoked. He has never used  smokeless tobacco. He reports that he does not drink alcohol or use drugs.  ROS: UROLOGY Frequent Urination?: Yes Hard to postpone urination?: No Burning/pain with urination?: No Get up at night to urinate?: Yes Leakage of urine?: No Urine stream starts and stops?: No Trouble starting stream?: No Do you have to strain to urinate?: No Blood in urine?: No Urinary tract infection?: No Sexually transmitted disease?: No Injury to kidneys or bladder?: No Painful intercourse?: No Weak stream?: No Erection problems?: No Penile pain?: No  Gastrointestinal Nausea?: No Vomiting?: No Indigestion/heartburn?: No Diarrhea?: No Constipation?: No  Constitutional Fever: No Night sweats?: Yes Weight loss?: No Fatigue?: No  Skin Skin rash/lesions?: No Itching?: No  Eyes Blurred vision?: No Double vision?: No  Ears/Nose/Throat Sore throat?: No Sinus problems?: Yes  Hematologic/Lymphatic Swollen glands?: No Easy bruising?: No  Cardiovascular Leg swelling?: No Chest pain?: No  Respiratory Cough?: No Shortness of breath?: No  Endocrine Excessive thirst?: No  Musculoskeletal Back pain?: No Joint pain?: No  Neurological Headaches?: No Dizziness?: No  Psychologic Depression?: No Anxiety?: No  Physical Exam: BP 119/67 (BP Location: Left Arm, Patient Position: Sitting, Cuff Size: Large)   Pulse 82   Ht 5\' 6"  (1.676 m)   Wt 183 lb 6.4 oz (83.2 kg)  BMI 29.60 kg/m   Constitutional:  Alert and oriented, No acute distress.   Assessment & Plan:   57 year old male with an elevated PSA and lower urinary tract symptoms.  He was unable to tolerate tamsulosin.  His PSA has not been repeated.    Rx alfuzosin was sent to his pharmacy.  Lab draw will be rescheduled for early January 2020.  Abbie Sons, [place] [address], [place], [place] [address] [phone]

## 2018-02-07 ENCOUNTER — Ambulatory Visit: Payer: Commercial Managed Care - PPO | Admitting: Urology

## 2018-02-07 ENCOUNTER — Encounter: Payer: Self-pay | Admitting: Urology

## 2018-02-07 VITALS — BP 115/71 | HR 81 | Ht 66.0 in | Wt 174.0 lb

## 2018-02-07 DIAGNOSIS — N401 Enlarged prostate with lower urinary tract symptoms: Secondary | ICD-10-CM | POA: Diagnosis not present

## 2018-02-07 DIAGNOSIS — K219 Gastro-esophageal reflux disease without esophagitis: Secondary | ICD-10-CM | POA: Diagnosis not present

## 2018-02-07 DIAGNOSIS — E119 Type 2 diabetes mellitus without complications: Secondary | ICD-10-CM | POA: Diagnosis not present

## 2018-02-07 DIAGNOSIS — R972 Elevated prostate specific antigen [PSA]: Secondary | ICD-10-CM | POA: Diagnosis not present

## 2018-02-07 DIAGNOSIS — J301 Allergic rhinitis due to pollen: Secondary | ICD-10-CM | POA: Diagnosis not present

## 2018-02-07 DIAGNOSIS — R6882 Decreased libido: Secondary | ICD-10-CM | POA: Diagnosis not present

## 2018-02-07 MED ORDER — ALFUZOSIN HCL ER 10 MG PO TB24: 10.0000 mg | ORAL_TABLET | Freq: Every day | ORAL | 3 refills | Status: AC

## 2018-02-07 NOTE — Progress Notes (Signed)
02/07/2018 10:47 AM   Wesley Simon Nov 13, 1960 [id number]  Referring provider: Tracie Harrier, MD [address] [facility] [place], [place] [address]  Chief Complaint  Patient presents with  . Elevated PSA    follow up    HPI: 58 year old male presents for follow-up of an elevated PSA, lower urinary tract symptoms and decreased libido.  He was last seen 11/29/2017.  He had side effects secondary to tamsulosin and was given a trial of alfuzosin.  He states this medication significantly improved his voiding symptoms and he had no side effects.  He desires to continue. He has not had his PSA repeated.  Prior DRE was benign.  PMH: Past Medical History:  Diagnosis Date  . Diabetes mellitus without complication ([place])   . Thyroid disease     Surgical History: No past surgical history on file.  Home Medications:  Allergies as of 02/07/2018      Reactions   Other    Pt states "all antibotics"    Tramadol-acetaminophen Rash      Medication List       Accurate as of February 07, 2018 10:47 AM. Always use your most recent med list.        alfuzosin 10 MG 24 hr tablet Commonly known as:  UROXATRAL Take 1 tablet (10 mg total) by mouth daily with breakfast.   atorvastatin 10 MG tablet Commonly known as:  LIPITOR Take by mouth.   glipiZIDE 5 MG 24 hr tablet Commonly known as:  GLUCOTROL XL   glucose blood test strip Use once daily. Use as instructed.   ibuprofen 800 MG tablet Commonly known as:  ADVIL,MOTRIN Take by mouth.   metFORMIN 500 MG 24 hr tablet Commonly known as:  GLUCOPHAGE-XR 2 (two) times daily.       Allergies:  Allergies  Allergen Reactions  . Other     Pt states "all antibotics"   . Tramadol-Acetaminophen Rash    Family History: No family history on file.  Social History:  reports that he has never smoked. He has never used smokeless tobacco. He reports that he does not drink alcohol or use  drugs.  ROS: UROLOGY Frequent Urination?: No Hard to postpone urination?: No Burning/pain with urination?: No Get up at night to urinate?: Yes Leakage of urine?: No Urine stream starts and stops?: No Trouble starting stream?: No Do you have to strain to urinate?: No Blood in urine?: No Urinary tract infection?: No Sexually transmitted disease?: No Injury to kidneys or bladder?: No Painful intercourse?: No Weak stream?: No Erection problems?: Yes Penile pain?: No  Gastrointestinal Nausea?: No Vomiting?: No Indigestion/heartburn?: No Diarrhea?: Yes Constipation?: No  Constitutional Fever: No Night sweats?: No Weight loss?: No Fatigue?: Yes  Skin Skin rash/lesions?: No Itching?: No  Eyes Blurred vision?: No Double vision?: No  Ears/Nose/Throat Sore throat?: No Sinus problems?: Yes  Hematologic/Lymphatic Swollen glands?: No Easy bruising?: No  Cardiovascular Leg swelling?: No Chest pain?: No  Respiratory Cough?: No Shortness of breath?: No  Endocrine Excessive thirst?: No  Musculoskeletal Back pain?: No Joint pain?: No  Neurological Headaches?: No Dizziness?: No  Psychologic Depression?: No Anxiety?: No  Physical Exam: BP 115/71   Pulse 81   Ht 5\' 6"  (1.676 m)   Wt 174 lb (78.9 kg)   BMI 28.08 kg/m   Constitutional:  Alert and oriented, No acute distress.   Assessment & Plan:    1. Low libido Repeat total testosterone along with a free testosterone  2. Elevated PSA PSA repeated today and if persistently elevated recommend prostate biopsy.  3.  BPH with lower urinary tract symptoms Significant improvement on alfuzosin which was refilled.   Return in about 1 year (around 02/08/2019) for Recheck.   Abbie Sons, [place] [address] [place], [place] [address] [phone]

## 2018-02-08 LAB — TESTOSTERONE,FREE AND TOTAL
Testosterone, Free: 9.7 pg/mL (ref 7.2–24.0)
Testosterone: 286 ng/dL (ref 264–916)

## 2018-02-08 LAB — PSA: PROSTATE SPECIFIC AG, SERUM: 2.3 ng/mL (ref 0.0–4.0)

## 2018-02-10 ENCOUNTER — Telehealth: Payer: Self-pay

## 2018-02-10 NOTE — Telephone Encounter (Signed)
Patient notified and voiced understanding.

## 2018-02-10 NOTE — Telephone Encounter (Signed)
Left message for patient to call office to discuss results

## 2018-02-10 NOTE — Telephone Encounter (Signed)
-----   Message from Abbie Sons, MD sent at 02/09/2018 10:34 AM EST ----- PSA returned to normal at 2.3.  Free and total testosterone levels were within the normal range

## 2018-02-14 DIAGNOSIS — E786 Lipoprotein deficiency: Secondary | ICD-10-CM | POA: Diagnosis not present

## 2018-02-14 DIAGNOSIS — E1165 Type 2 diabetes mellitus with hyperglycemia: Secondary | ICD-10-CM | POA: Diagnosis not present

## 2018-02-14 DIAGNOSIS — Z0000 Encounter for general adult medical examination without abnormal findings: Secondary | ICD-10-CM | POA: Diagnosis not present

## 2018-03-19 DIAGNOSIS — J209 Acute bronchitis, unspecified: Secondary | ICD-10-CM | POA: Diagnosis not present

## 2018-03-19 DIAGNOSIS — R05 Cough: Secondary | ICD-10-CM | POA: Diagnosis not present

## 2018-04-18 DIAGNOSIS — M545 Low back pain: Secondary | ICD-10-CM | POA: Diagnosis not present

## 2018-04-18 DIAGNOSIS — M9903 Segmental and somatic dysfunction of lumbar region: Secondary | ICD-10-CM | POA: Diagnosis not present

## 2018-04-18 DIAGNOSIS — M9901 Segmental and somatic dysfunction of cervical region: Secondary | ICD-10-CM | POA: Diagnosis not present

## 2018-04-25 DIAGNOSIS — M9901 Segmental and somatic dysfunction of cervical region: Secondary | ICD-10-CM | POA: Diagnosis not present

## 2018-04-25 DIAGNOSIS — M545 Low back pain: Secondary | ICD-10-CM | POA: Diagnosis not present

## 2018-04-25 DIAGNOSIS — M9903 Segmental and somatic dysfunction of lumbar region: Secondary | ICD-10-CM | POA: Diagnosis not present

## 2018-05-07 DIAGNOSIS — M9903 Segmental and somatic dysfunction of lumbar region: Secondary | ICD-10-CM | POA: Diagnosis not present

## 2018-05-07 DIAGNOSIS — M9901 Segmental and somatic dysfunction of cervical region: Secondary | ICD-10-CM | POA: Diagnosis not present

## 2018-05-07 DIAGNOSIS — M545 Low back pain: Secondary | ICD-10-CM | POA: Diagnosis not present

## 2018-05-15 DIAGNOSIS — M9903 Segmental and somatic dysfunction of lumbar region: Secondary | ICD-10-CM | POA: Diagnosis not present

## 2018-05-15 DIAGNOSIS — M9901 Segmental and somatic dysfunction of cervical region: Secondary | ICD-10-CM | POA: Diagnosis not present

## 2018-05-15 DIAGNOSIS — M545 Low back pain: Secondary | ICD-10-CM | POA: Diagnosis not present

## 2019-02-09 ENCOUNTER — Ambulatory Visit: Payer: Commercial Managed Care - PPO | Admitting: Urology

## 2020-05-27 ENCOUNTER — Ambulatory Visit
Admission: RE | Admit: 2020-05-27 | Discharge: 2020-05-27 | Disposition: A | Discharge status: Home / Self Care | Payer: Commercial Managed Care - PPO | Source: Ambulatory Visit | Attending: Internal Medicine | Admitting: Internal Medicine

## 2020-05-27 ENCOUNTER — Other Ambulatory Visit: Payer: Self-pay | Admitting: Internal Medicine

## 2020-05-27 ENCOUNTER — Other Ambulatory Visit: Payer: Self-pay

## 2020-05-27 DIAGNOSIS — K920 Hematemesis: Secondary | ICD-10-CM | POA: Diagnosis not present

## 2020-05-27 MED ORDER — IOHEXOL 350 MG/ML SOLN
75.0000 mL | Freq: Once | INTRAVENOUS | Status: AC | PRN
  Administered 2020-05-27: 75 mL via INTRAVENOUS

## 2021-04-11 ENCOUNTER — Other Ambulatory Visit (HOSPITAL_COMMUNITY): Payer: Self-pay | Admitting: Internal Medicine

## 2021-04-11 ENCOUNTER — Other Ambulatory Visit: Payer: Self-pay | Admitting: Internal Medicine

## 2021-04-11 DIAGNOSIS — E1165 Type 2 diabetes mellitus with hyperglycemia: Secondary | ICD-10-CM

## 2021-04-11 DIAGNOSIS — N50811 Right testicular pain: Secondary | ICD-10-CM

## 2021-04-17 ENCOUNTER — Other Ambulatory Visit: Payer: Commercial Managed Care - PPO

## 2021-04-21 ENCOUNTER — Other Ambulatory Visit: Payer: Commercial Managed Care - PPO

## 2021-06-24 ENCOUNTER — Emergency Department: Payer: Commercial Managed Care - PPO

## 2021-06-24 ENCOUNTER — Other Ambulatory Visit: Payer: Self-pay

## 2021-06-24 ENCOUNTER — Emergency Department (HOSPITAL_COMMUNITY): Payer: Commercial Managed Care - PPO

## 2021-06-24 ENCOUNTER — Emergency Department
Admission: EM | Admit: 2021-06-24 | Discharge: 2021-06-24 | Disposition: A | Discharge status: Home / Self Care | Payer: Commercial Managed Care - PPO | Attending: Emergency Medicine | Admitting: Emergency Medicine

## 2021-06-24 DIAGNOSIS — R109 Unspecified abdominal pain: Secondary | ICD-10-CM | POA: Diagnosis present

## 2021-06-24 DIAGNOSIS — R059 Cough, unspecified: Secondary | ICD-10-CM | POA: Insufficient documentation

## 2021-06-24 DIAGNOSIS — E1165 Type 2 diabetes mellitus with hyperglycemia: Secondary | ICD-10-CM | POA: Insufficient documentation

## 2021-06-24 DIAGNOSIS — K529 Noninfective gastroenteritis and colitis, unspecified: Secondary | ICD-10-CM | POA: Diagnosis not present

## 2021-06-24 DIAGNOSIS — R748 Abnormal levels of other serum enzymes: Secondary | ICD-10-CM | POA: Diagnosis not present

## 2021-06-24 DIAGNOSIS — K219 Gastro-esophageal reflux disease without esophagitis: Secondary | ICD-10-CM | POA: Insufficient documentation

## 2021-06-24 LAB — CBC
HCT: 48.5 % (ref 39.0–52.0)
Hemoglobin: 16.3 g/dL (ref 13.0–17.0)
MCH: 28.5 pg (ref 26.0–34.0)
MCHC: 33.6 g/dL (ref 30.0–36.0)
MCV: 84.8 fL (ref 80.0–100.0)
Platelets: 400 10*3/uL (ref 150–400)
RBC: 5.72 MIL/uL (ref 4.22–5.81)
RDW: 12.6 % (ref 11.5–15.5)
WBC: 9.3 10*3/uL (ref 4.0–10.5)
nRBC: 0 % (ref 0.0–0.2)

## 2021-06-24 LAB — URINALYSIS, ROUTINE W REFLEX MICROSCOPIC
Bilirubin Urine: NEGATIVE
Glucose, UA: 150 mg/dL — AB
Hgb urine dipstick: NEGATIVE
Ketones, ur: NEGATIVE mg/dL
Leukocytes,Ua: NEGATIVE
Nitrite: NEGATIVE
Protein, ur: NEGATIVE mg/dL
Specific Gravity, Urine: 1.045 — ABNORMAL HIGH (ref 1.005–1.030)
pH: 5 (ref 5.0–8.0)

## 2021-06-24 LAB — COMPREHENSIVE METABOLIC PANEL
ALT: 30 U/L (ref 0–44)
AST: 20 U/L (ref 15–41)
Albumin: 3.9 g/dL (ref 3.5–5.0)
Alkaline Phosphatase: 61 U/L (ref 38–126)
Anion gap: 9 (ref 5–15)
BUN: 16 mg/dL (ref 6–20)
CO2: 25 mmol/L (ref 22–32)
Calcium: 9.5 mg/dL (ref 8.9–10.3)
Chloride: 101 mmol/L (ref 98–111)
Creatinine, Ser: 1 mg/dL (ref 0.61–1.24)
GFR, Estimated: >60 mL/min (ref >60)
Glucose, Bld: 226 mg/dL — ABNORMAL HIGH (ref 70–99)
Potassium: 4.3 mmol/L (ref 3.5–5.1)
Sodium: 135 mmol/L (ref 135–145)
Total Bilirubin: 1.3 mg/dL — ABNORMAL HIGH (ref 0.3–1.2)
Total Protein: 7 g/dL (ref 6.5–8.1)

## 2021-06-24 LAB — LIPASE, BLOOD: Lipase: 64 U/L — ABNORMAL HIGH (ref 11–51)

## 2021-06-24 MED ORDER — SUCRALFATE 1 GM/10ML PO SUSP: 1.0000 g | Freq: Four times a day (QID) | ORAL | 0 refills | Status: AC | PRN

## 2021-06-24 MED ORDER — PANTOPRAZOLE SODIUM 40 MG PO TBEC: 40.0000 mg | DELAYED_RELEASE_TABLET | Freq: Every day | ORAL | 0 refills | Status: AC

## 2021-06-24 MED ORDER — ONDANSETRON HCL 4 MG/2ML IJ SOLN
4.0000 mg | Freq: Once | INTRAMUSCULAR | Status: AC
  Administered 2021-06-24: 4 mg via INTRAVENOUS
  Filled 2021-06-24: qty 2

## 2021-06-24 MED ORDER — SODIUM CHLORIDE 0.9 % IV BOLUS
1000.0000 mL | Freq: Once | INTRAVENOUS | Status: AC
  Administered 2021-06-24: 1000 mL via INTRAVENOUS

## 2021-06-24 MED ORDER — IOHEXOL 300 MG/ML  SOLN
100.0000 mL | Freq: Once | INTRAMUSCULAR | Status: AC | PRN
  Administered 2021-06-24: 100 mL via INTRAVENOUS

## 2021-06-24 NOTE — ED Notes (Signed)
Pt states he is unable to urinate at this time- pt informed to hit the call bell when he is able to give sample.

## 2021-06-24 NOTE — ED Triage Notes (Signed)
Pt states for the past 2 weeks he has had cough, abd pain, and acid reflux off and on- pt states when he has anything hot he throws it back up

## 2021-06-24 NOTE — ED Provider Notes (Signed)
[facility] Provider Note    Event Date/Time   First MD Initiated Contact with Patient 06/24/21 1500     (approximate)   History   Abdominal Pain   HPI  Wesley Simon is a 61 y.o. male  with history of DM II, GERD, OSA and as listed in EMR presents to the emergency department for evaluation of cough and abdominal pain with vomiting. Cough has been present for the past 2 weeks. He states that his GERD has gotten worse despite taking his medication as prescribed. He denies fever. He last vomited yesterday.      Physical Exam   Triage Vital Signs: ED Triage Vitals  Enc Vitals Group     BP 06/24/21 1333 117/79     Pulse Rate 06/24/21 1333 100     Resp 06/24/21 1333 18     Temp 06/24/21 1333 98.9 F (37.2 C)     Temp Source 06/24/21 1333 Oral     SpO2 06/24/21 1333 97 %     Weight 06/24/21 1333 171 lb (77.6 kg)     Height 06/24/21 1333 '5\' 6"'$  (1.676 m)     Head Circumference --      Peak Flow --      Pain Score 06/24/21 1338 6     Pain Loc --      Pain Edu? --      Excl. in [place]? --     Most recent vital signs: Vitals:   06/24/21 1333 06/24/21 1704  BP: 117/79 121/80  Pulse: 100 77  Resp: 18 18  Temp: 98.9 F (37.2 C)   SpO2: 97% 98%    General: Awake, no distress.  CV:  Good peripheral perfusion.  Resp:  Normal effort.  Abd:  No distention. No focal tenderness. No rebound tenderness or guarding. Other:     ED Results / Procedures / Treatments   Labs (all labs ordered are listed, but only abnormal results are displayed) Labs Reviewed  LIPASE, BLOOD - Abnormal; Notable for the following components:      Result Value   Lipase 64 (*)    All other components within normal limits  COMPREHENSIVE METABOLIC PANEL - Abnormal; Notable for the following components:   Glucose, Bld 226 (*)    Total Bilirubin 1.3 (*)    All other components within normal limits  URINALYSIS, ROUTINE W REFLEX MICROSCOPIC - Abnormal; Notable for the following  components:   Color, Urine YELLOW (*)    APPearance CLEAR (*)    Specific Gravity, Urine 1.045 (*)    Glucose, UA 150 (*)    All other components within normal limits  CBC     EKG  ED ECG REPORT I, Tamu Golz, FNP-BC personally viewed and interpreted this ECG.   Date: 06/24/2021  EKG Time: 1349  Rate: 85  Rhythm: normal EKG, normal sinus rhythm, unchanged from previous tracings  Axis: normal  Intervals:none  ST&T Change: no     RADIOLOGY  Image interpreted and radiology report reviewed by me.  CT abdomen and pelvis shows small hiatal hernia and evidence of enteritis.  PROCEDURES:  Critical Care performed: No  Procedures   MEDICATIONS ORDERED IN ED: Medications  sodium chloride 0.9 % bolus 1,000 mL (0 mLs Intravenous Stopped 06/24/21 1704)  ondansetron (ZOFRAN) injection 4 mg (4 mg Intravenous Given 06/24/21 1541)  iohexol (OMNIPAQUE) 300 MG/ML solution 100 mL (100 mLs Intravenous Contrast Given 06/24/21 1640)     IMPRESSION / MDM /  ASSESSMENT AND PLAN / ED COURSE   I have reviewed the triage note.  Differential diagnosis includes, but is not limited to: bowel obstruction, appendicitis, acute cholelithiasis, enteritis.  61 year old male presenting to the emergency department for treatment and evaluation of abdominal pain with nausea and vomiting.  See HPI for further details.  Labs are overall reassuring.  He has a normal white count, electrolytes with exception of glucose elevated.  Patient is diabetic and has not yet taken his medications.  Urinalysis shows glucosuria.  Lipase is slightly elevated at 64 but it appears that this is a chronic elevation.  CT of the abdomen and pelvis is without acute concerns.  He may possibly have some enteritis which would explain his symptoms.  Plan will be to treat him with Zofran which worked well here in the emergency department.  He will also be given a prescription for Protonix to replace the omeprazole and Carafate to  be taken if needed for GERD esophagitis.  Patient advised to call and schedule follow-up appointment with his gastroenterologist or primary care provider for symptoms that are not improving over the next few days.  He was advised to return to the emergency department for symptoms that change or worsen if he is unable to schedule appointment.      FINAL CLINICAL IMPRESSION(S) / ED DIAGNOSES   Final diagnoses:  Gastroesophageal reflux disease without esophagitis  Enteritis     Rx / DC Orders   ED Discharge Orders          Ordered    pantoprazole (PROTONIX) 40 MG tablet  Daily        06/24/21 1749    sucralfate (CARAFATE) 1 GM/10ML suspension  Every 6 hours PRN        06/24/21 1749             Note:  This document was prepared using Dragon voice recognition software and may include unintentional dictation errors.   Victorino Dike, FNP 06/24/21 Consuello Closs, MD 06/24/21 1902

## 2022-11-08 IMAGING — CT CT ABD-PELV W/ CM
2 of 5 series · 16 of 46 positions shown, 18 images · IV contrast (APPLIED)
Comparison: 10/20/2012 CT stone study

CLINICAL DATA: Bloating with nausea vomiting for 2 years.
Hematemesis 2 weeks ago. History gastroesophageal reflux disease.
Type 2 diabetes.

EXAM:
CT ABDOMEN AND PELVIS WITH CONTRAST
TECHNIQUE: Multidetector CT imaging of the abdomen and pelvis was performed
using the standard protocol following bolus administration of
intravenous contrast.
CONTRAST:  75mL OMNIPAQUE IOHEXOL 350 MG/ML SOLN

[Series 2: routine abd/pel with · axial · 0.76mm/px · z∈[-458,-28]mm · 13 of 98 slices shown, 15 images]
[im 6/98  soft-tissue]
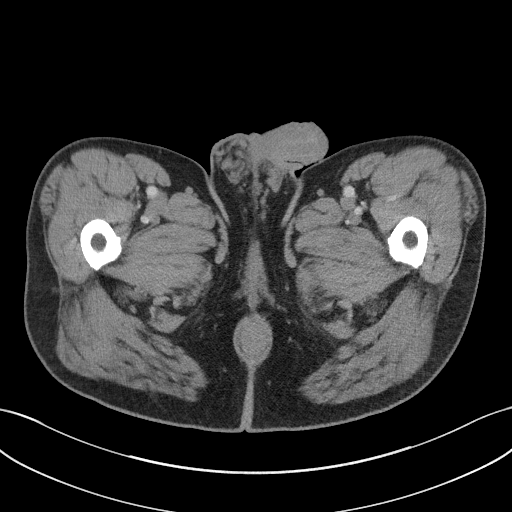
[im 6/98  bone]
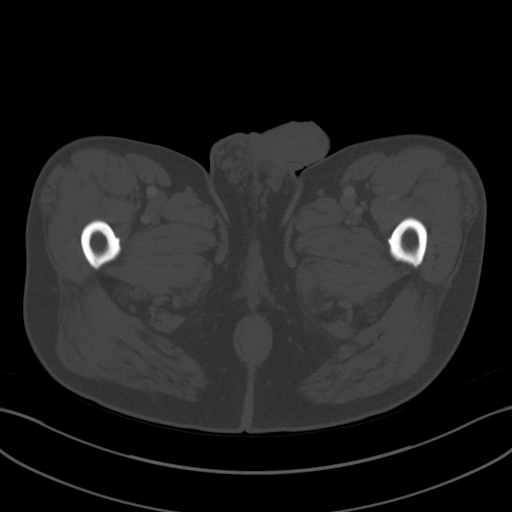
[im 11/98  soft-tissue]
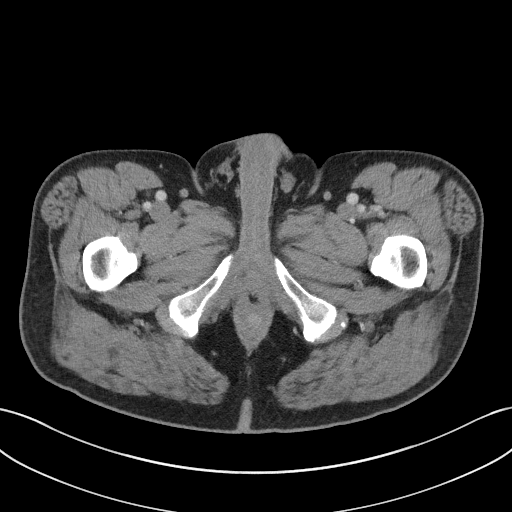
[im 22/98  soft-tissue]
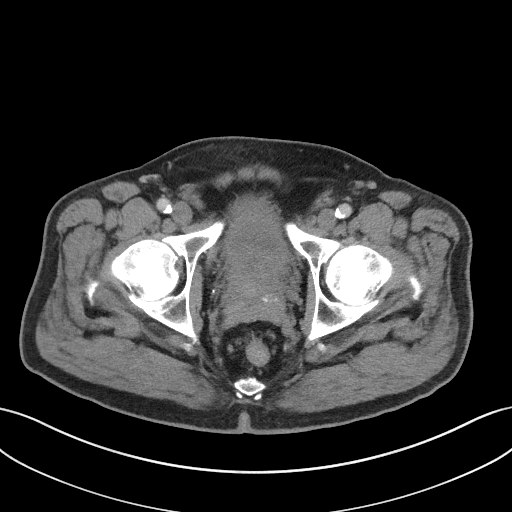
[im 27/98  soft-tissue]
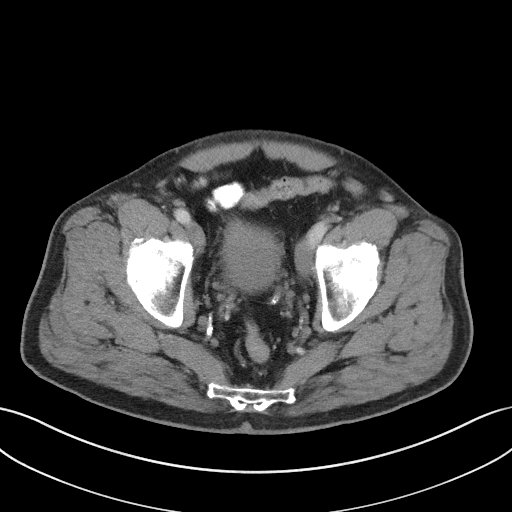
[im 33/98  soft-tissue]
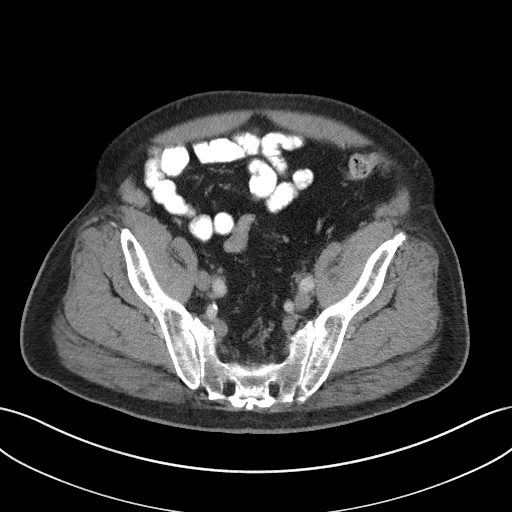
[im 44/98  soft-tissue]
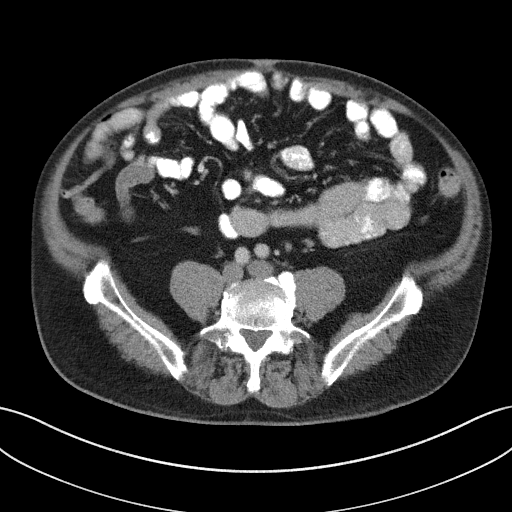
[im 49/98  soft-tissue]
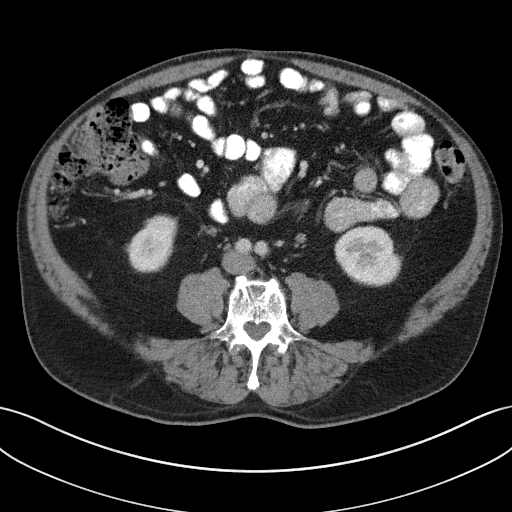
[im 54/98  soft-tissue]
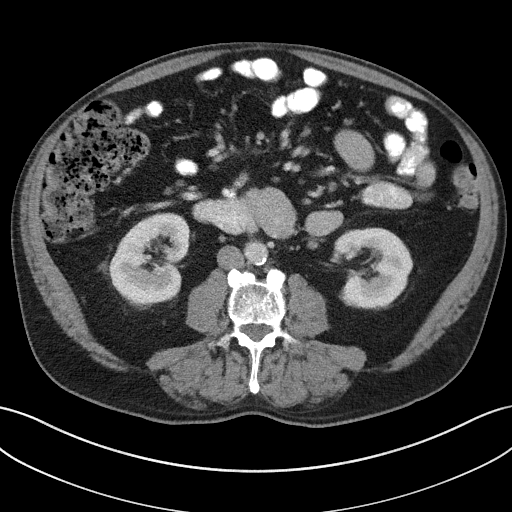
[im 65/98  soft-tissue]
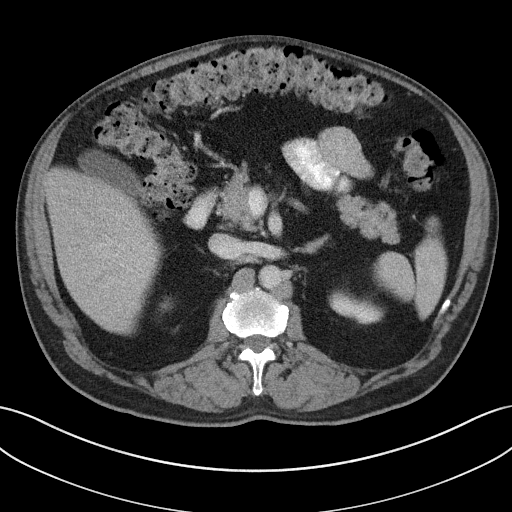
[im 65/98  bone]
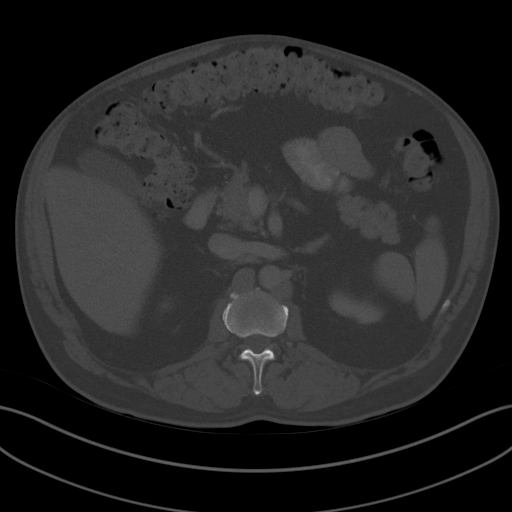
[im 71/98  soft-tissue]
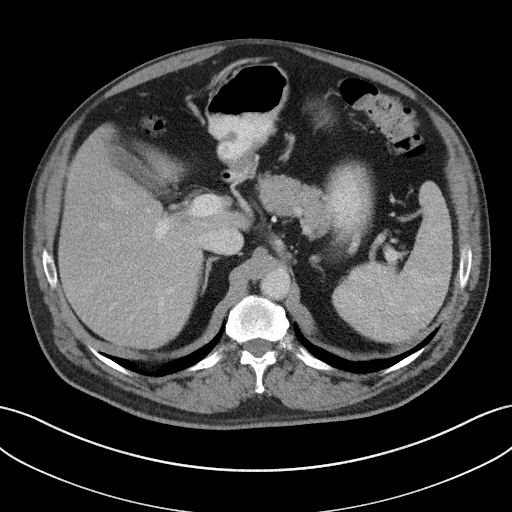
[im 76/98  soft-tissue]
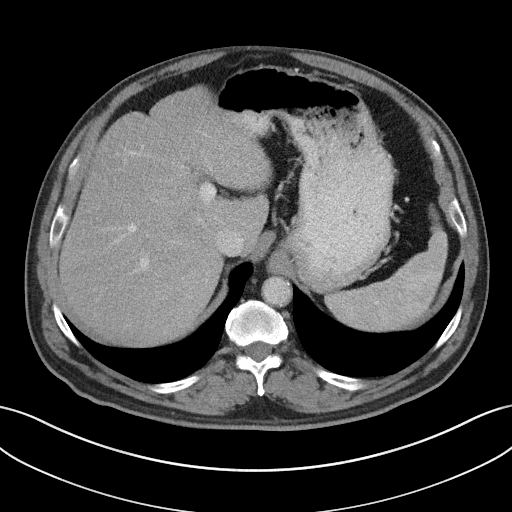
[im 87/98  soft-tissue]
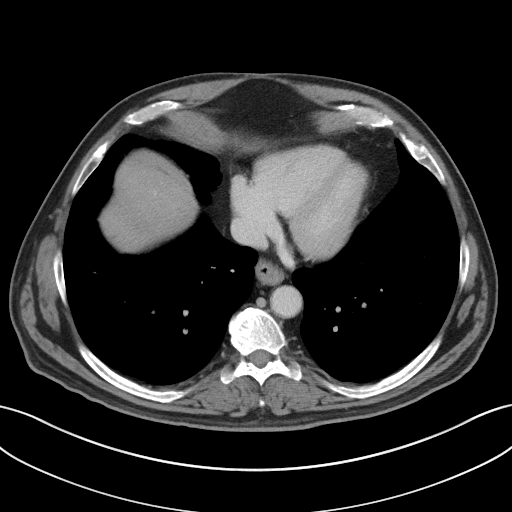
[im 92/98  soft-tissue]
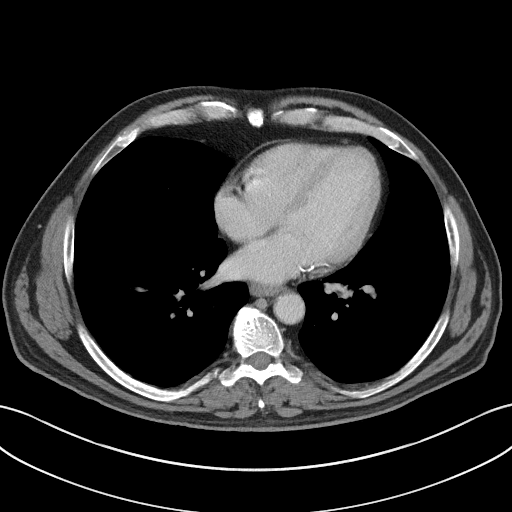

[Series 5: coronal st · coronal · 0.77mm/px · 3 of 103 slices shown]
[im 35/103  soft-tissue]
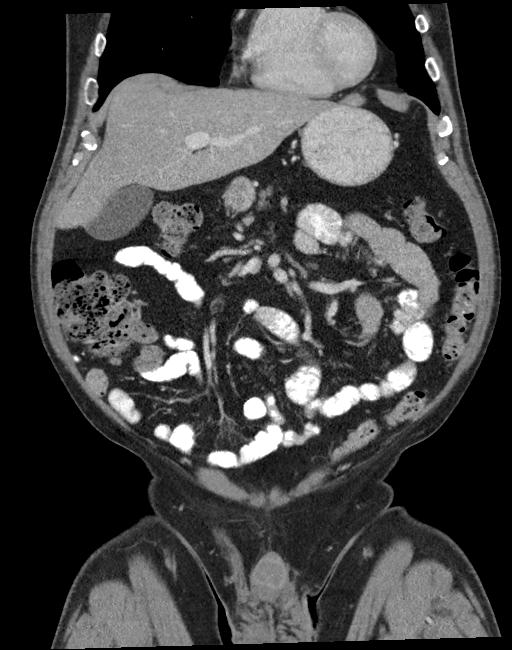
[im 46/103  soft-tissue]
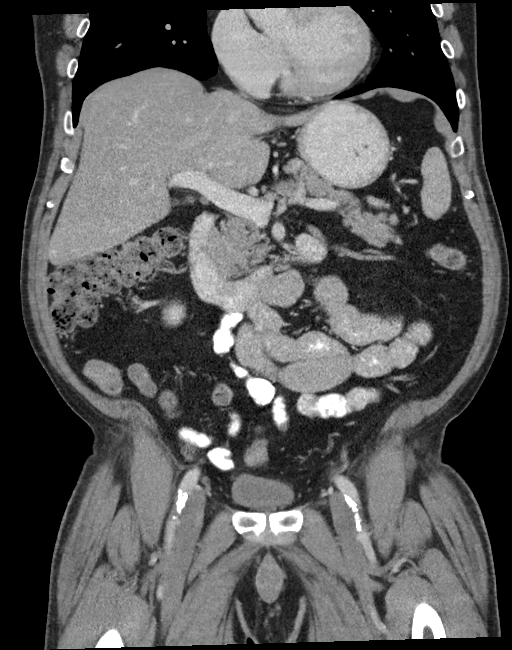
[im 57/103  soft-tissue]
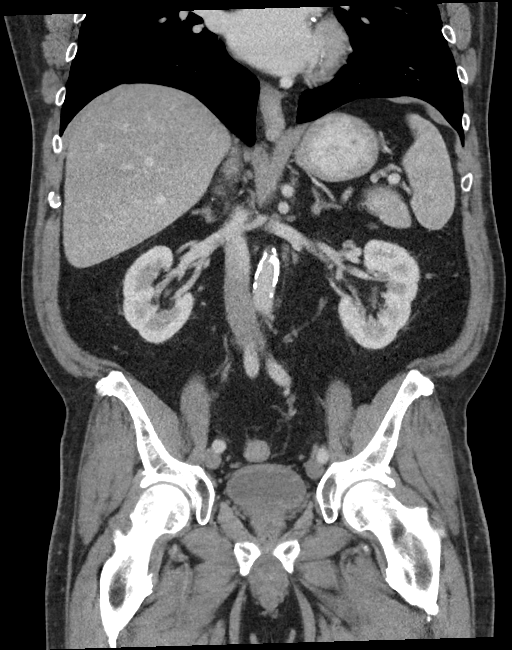

[16 of 46 positions shown; findings below may reference images not displayed]

FINDINGS: Lower chest: Clear lung bases. Normal heart size without pericardial
or pleural effusion. Tiny hiatal hernia. Right and left circumflex
coronary artery calcification. Aortic valve calcification.

Hepatobiliary: Normal liver. Normal gallbladder, without biliary
ductal dilatation.

Pancreas: Normal, without mass or ductal dilatation.

Spleen: Normal in size, without focal abnormality.

Adrenals/Urinary Tract: Normal adrenal glands. Probable punctate
upper pole left renal collecting system calculus. Normal right
kidney. Normal urinary bladder.

Stomach/Bowel: Normal remainder of the stomach. Colonic stool burden
suggests constipation. Normal terminal ileum and appendix. Normal
small bowel.

Vascular/Lymphatic: Aortic atherosclerosis. No abdominopelvic
adenopathy.

Reproductive: Moderate prostatomegaly. Trace right hydrocele is
likely physiologic.

Other: No significant free fluid.

Musculoskeletal: Mid and lower lumbar spondylosis.
IMPRESSION: 1. Possible constipation. No other explanation for patient's
symptoms.
2. Tiny hiatal hernia.
3. Prostatomegaly.
4. Probable left nephrolithiasis.
5. Aortic Atherosclerosis ([id number]). Coronary artery
atherosclerosis.

## 2022-12-10 ENCOUNTER — Other Ambulatory Visit: Payer: Self-pay

## 2022-12-10 ENCOUNTER — Emergency Department
Admission: EM | Admit: 2022-12-10 | Discharge: 2022-12-10 | Disposition: A | Discharge status: Home / Self Care | Payer: 59 | Attending: Emergency Medicine | Admitting: Emergency Medicine

## 2022-12-10 DIAGNOSIS — E1165 Type 2 diabetes mellitus with hyperglycemia: Secondary | ICD-10-CM | POA: Insufficient documentation

## 2022-12-10 DIAGNOSIS — Z0189 Encounter for other specified special examinations: Secondary | ICD-10-CM | POA: Diagnosis present

## 2022-12-10 LAB — BASIC METABOLIC PANEL
Anion gap: 12 (ref 5–15)
BUN: 17 mg/dL (ref 6–20)
CO2: 21 mmol/L — ABNORMAL LOW (ref 22–32)
Calcium: 9.2 mg/dL (ref 8.9–10.3)
Chloride: 101 mmol/L (ref 98–111)
Creatinine, Ser: 0.88 mg/dL (ref 0.61–1.24)
GFR, Estimated: >60 mL/min (ref >60)
Glucose, Bld: 107 mg/dL — ABNORMAL HIGH (ref 70–99)
Potassium: 4.1 mmol/L (ref 3.5–5.1)
Sodium: 134 mmol/L — ABNORMAL LOW (ref 135–145)

## 2022-12-10 LAB — HEMOGLOBIN A1C
Hgb A1c MFr Bld: 7.6 % — ABNORMAL HIGH (ref 4.8–5.6)
Mean Plasma Glucose: 171.42 mg/dL

## 2022-12-10 LAB — CBC
HCT: 46.3 % (ref 39.0–52.0)
Hemoglobin: 16.4 g/dL (ref 13.0–17.0)
MCH: 30 pg (ref 26.0–34.0)
MCHC: 35.4 g/dL (ref 30.0–36.0)
MCV: 84.6 fL (ref 80.0–100.0)
Platelets: 269 10*3/uL (ref 150–400)
RBC: 5.47 MIL/uL (ref 4.22–5.81)
RDW: 12.8 % (ref 11.5–15.5)
WBC: 7.4 10*3/uL (ref 4.0–10.5)
nRBC: 0 % (ref 0.0–0.2)

## 2022-12-10 NOTE — Discharge Instructions (Signed)
Your blood sugar today was normal at 107.  You will find the results of your A1c on MyChart.  Please follow-up with your outpatient provider.  Please return for any new, worsening, or change in symptoms or other concerns.  Was a pleasure caring for you today.

## 2022-12-10 NOTE — ED Triage Notes (Addendum)
Pt comes and states he thinks his sugar is elevated and needs it checked. Pt states he doesn't want to be driving and pass out. Pt states he thinks he needs his A1C checked too.  Pt states he takes metformin and another med for his diabetes.

## 2022-12-10 NOTE — ED Triage Notes (Signed)
First RN: pt states that he needs his A1C checked for his job.

## 2022-12-10 NOTE — ED Provider Notes (Signed)
[facility] Provider Note    Event Date/Time   First MD Initiated Contact with Patient 12/10/22 1033     (approximate)   History   Hyperglycemia   HPI  Wesley Simon is a 62 y.o. male with a past medical history of type 2 diabetes, GERD who presents today with request for A1c.  Patient reports that this is a requirement for his workplace.  He reports that he tried to go to the walk-in clinic but they told him that it would be 3 days for the results and he is hoping to return to work on Wednesday.  He reports that he has been compliant with his diabetes medications.  He denies any symptoms today.  He reports that he is here for his test only.  Denies polydipsia, polyuria.  No nausea or vomiting.  Patient reports that he feels his normal self.  He reports that he was on steroids at 1 point and this increased his A1c to 10 so he is worried.  But he reports that he feels well today.  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   Allergic rhinitis due to pollen 10/11/2014          Physical Exam   Triage Vital Signs: ED Triage Vitals  Encounter Vitals Group     BP 12/10/22 0950 (!) 162/92     Systolic BP Percentile --      Diastolic BP Percentile --      Pulse Rate 12/10/22 0950 100     Resp 12/10/22 0950 18     Temp 12/10/22 0950 98 F (36.7 C)     Temp src --      SpO2 12/10/22 0950 100 %     Weight --      Height --      Head Circumference --      Peak Flow --      Pain Score 12/10/22 0949 0     Pain Loc --      Pain Education --      Exclude from Growth Chart --     Most recent vital signs: Vitals:   12/10/22 0950  BP: (!) 162/92  Pulse: 100  Resp: 18   Temp: 98 F (36.7 C)  SpO2: 100%    Physical Exam Vitals and nursing note reviewed.  Constitutional:      General: Awake and alert. No acute distress.    Appearance: Normal appearance. The patient is normal weight.  HENT:     Head: Normocephalic and atraumatic.     Mouth: Mucous membranes are moist.  Eyes:     General: PERRL. Normal EOMs        Right eye: No discharge.        Left eye: No discharge.     Conjunctiva/sclera: Conjunctivae normal.  Cardiovascular:     Rate and Rhythm: Normal rate and regular rhythm.     Pulses: Normal pulses.  Pulmonary:     Effort: Pulmonary effort is normal. No respiratory distress.     Breath sounds: Normal breath sounds.  Abdominal:     Abdomen is soft. There is no abdominal tenderness. No rebound or guarding. No distention. Musculoskeletal:  General: No swelling. Normal range of motion.     Cervical back: Normal range of motion and neck supple.  Skin:    General: Skin is warm and dry.     Capillary Refill: Capillary refill takes less than 2 seconds.     Findings: No rash.  Neurological:     Mental Status: The patient is awake and alert.      ED Results / Procedures / Treatments   Labs (all labs ordered are listed, but only abnormal results are displayed) Labs Reviewed  BASIC METABOLIC PANEL - Abnormal; Notable for the following components:      Result Value   Sodium 134 (*)    CO2 21 (*)    Glucose, Bld 107 (*)    All other components within normal limits  CBC  URINALYSIS, ROUTINE W REFLEX MICROSCOPIC  HEMOGLOBIN A1C  CBG MONITORING, ED  CBG MONITORING, ED     EKG     RADIOLOGY     PROCEDURES:  Critical Care performed:   Procedures   MEDICATIONS ORDERED IN ED: Medications - No data to display   IMPRESSION / MDM / ASSESSMENT AND PLAN / ED COURSE  I reviewed the triage vital signs and the nursing notes.   Differential diagnosis includes, but is not limited to, hyperglycemia, euglycemia,  electrolyte disarray, request for test.  Patient is awake and alert, hemodynamically stable and afebrile.  He is nontoxic in appearance.  He has no complaints today except only wants his A1c tested.  Labs were obtained in triage and are overall reassuring.  He has a normal blood sugar today of 107 which he is reassured about.  A1c was also sent.  I spoke with lab who reports that this should result later today, though could be in 1 to 2 days.  Patient was instructed on how to find these results on MyChart.  We discussed return precautions and outpatient follow-up.  Patient understands and agrees with plan.  He was discharged in stable condition.   Patient's presentation is most consistent with acute complicated illness / injury requiring diagnostic workup.    FINAL CLINICAL IMPRESSION(S) / ED DIAGNOSES   Final diagnoses:  Patient request for diagnostic testing     Rx / DC Orders   ED Discharge Orders     None        Note:  This document was prepared using Dragon voice recognition software and may include unintentional dictation errors.   Jackelyn Hoehn, PA-C 12/10/22 1416    Chesley Noon, MD 12/10/22 [phone]

## 2023-12-06 IMAGING — CR DG CHEST 2V
1 series · 2 of 2 positions shown · non-contrast
Comparison: 03/19/2013

CLINICAL DATA: Cough for 2 weeks.

EXAM:
CHEST - 2 VIEW

[Series 1: dg chest 2 view · 0.14mm/px · 2 of 2 slices shown]
[im 1/2]
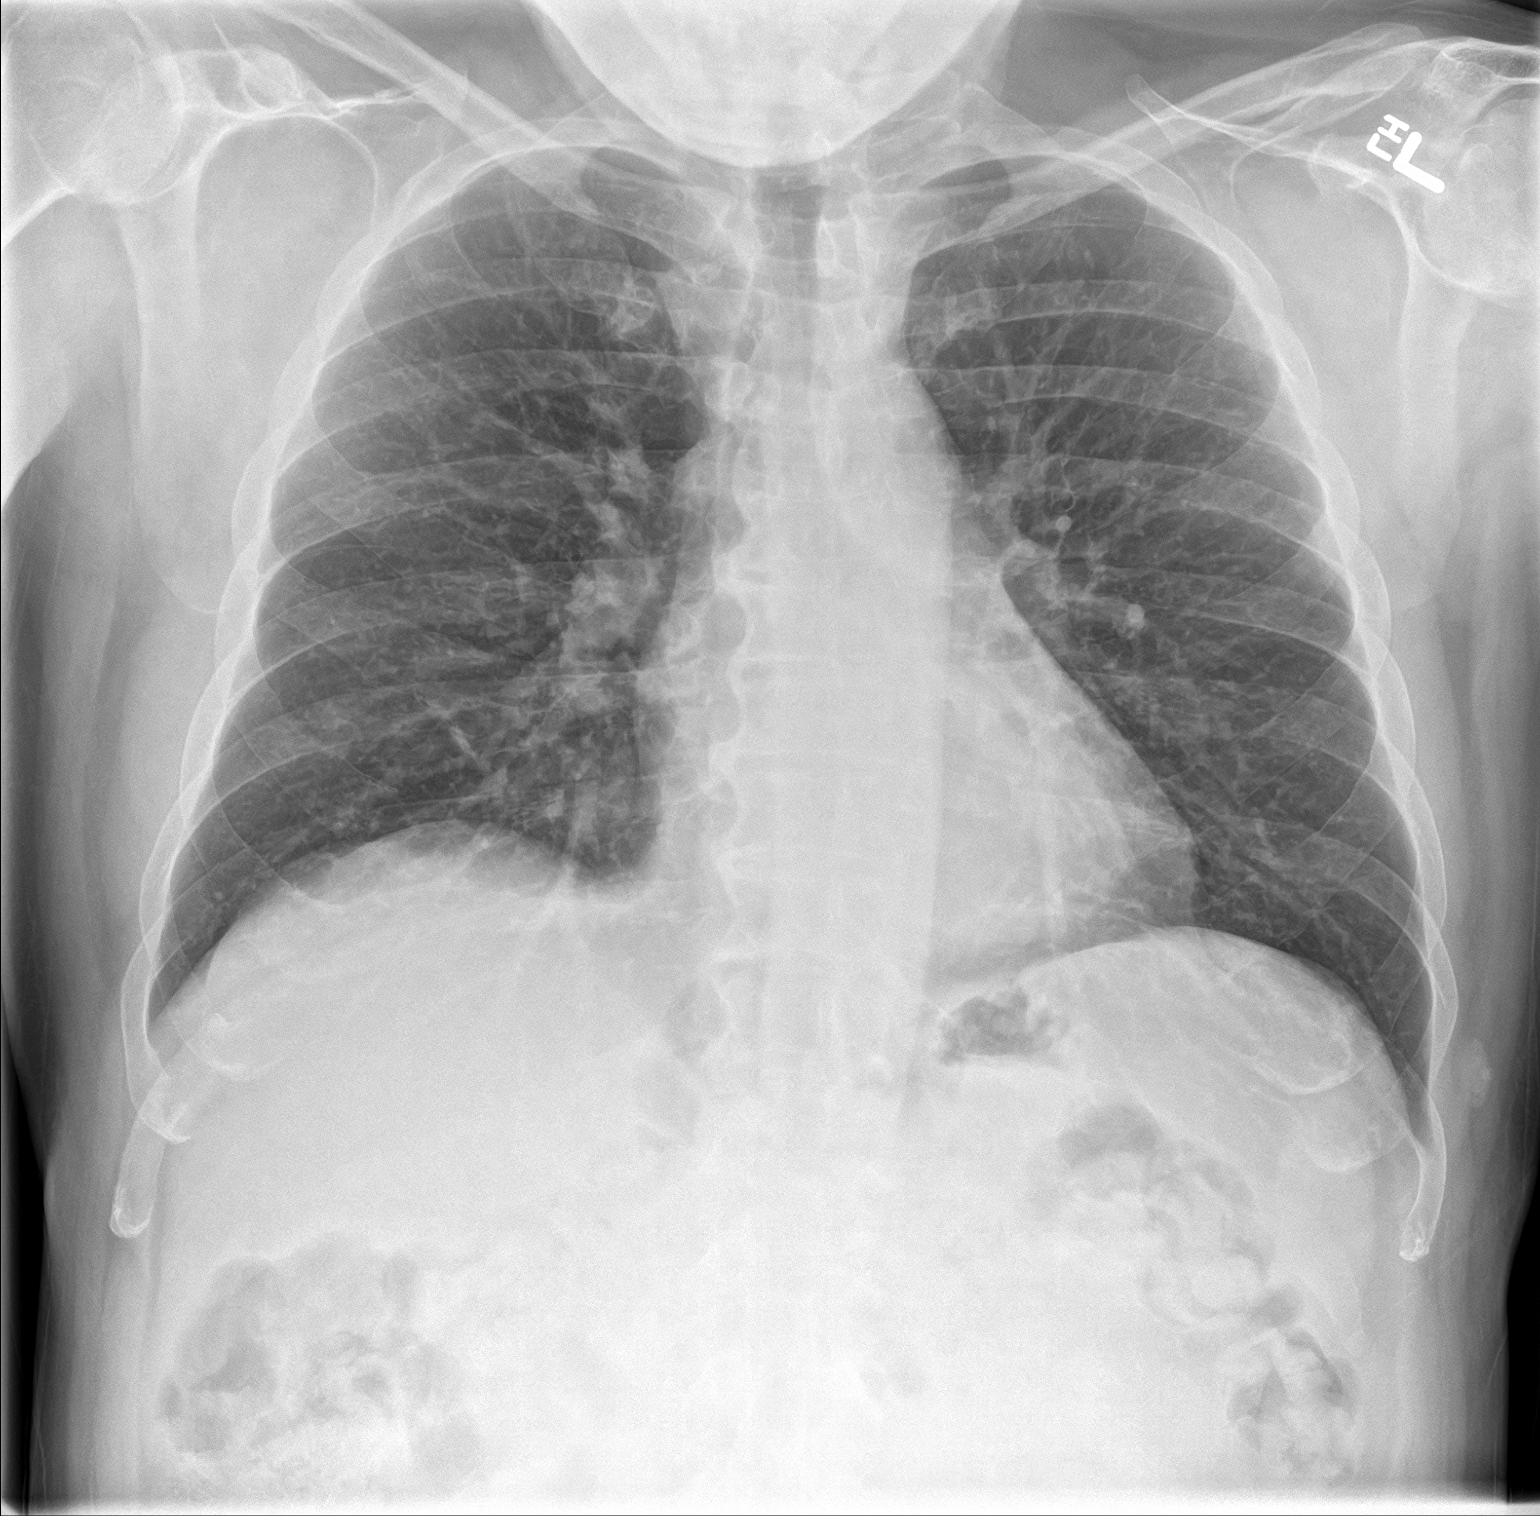
[im 2/2]
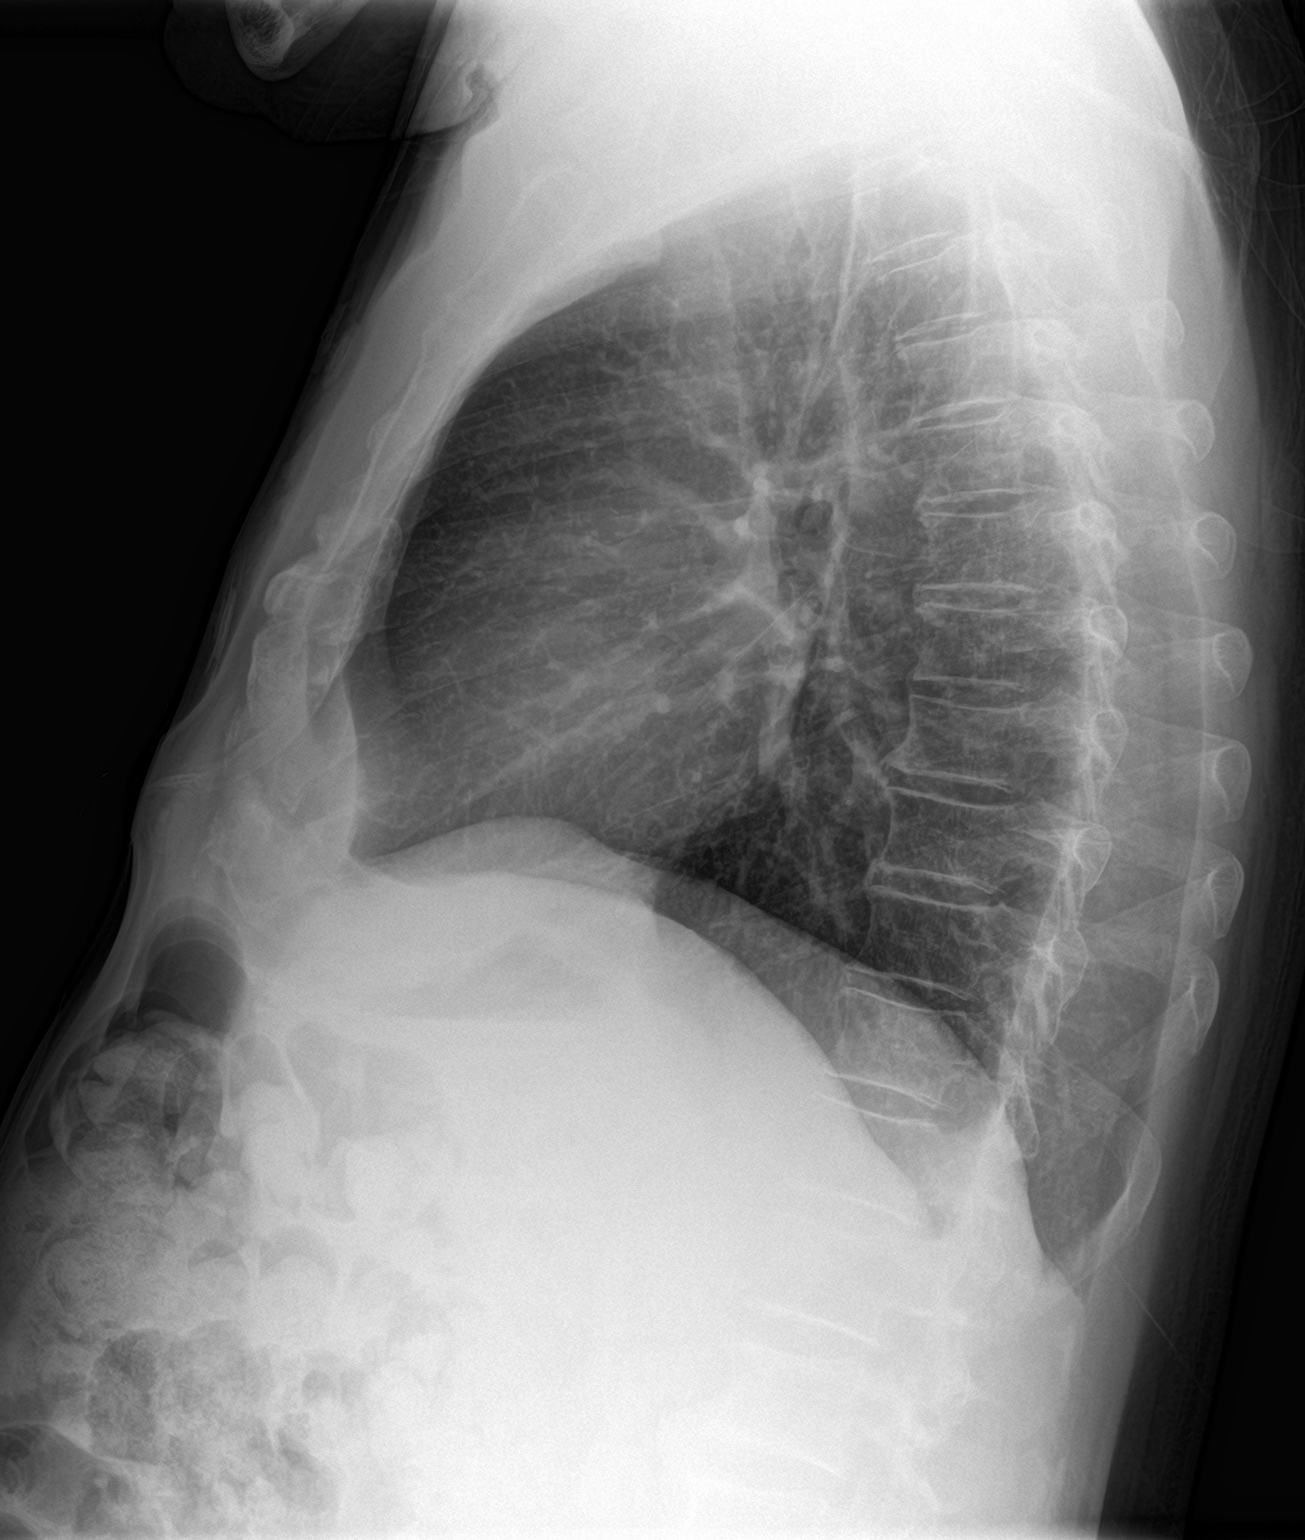

[2 of 2 positions shown; findings below may reference images not displayed]

FINDINGS: Heart size and mediastinal contours are unremarkable. No pleural
effusion or edema identified. No airspace opacities. Visualized
osseous structures appear intact.
IMPRESSION: No active cardiopulmonary abnormalities.

## 2023-12-06 IMAGING — CT CT ABD-PELV W/ CM
2 of 5 series · 16 of 46 positions shown, 18 images · IV contrast (APPLIED)
Comparison: 05/27/2020

CLINICAL DATA: Cough, abdominal pain, acid reflux for 2 weeks,
vomiting

EXAM:
CT ABDOMEN AND PELVIS WITH CONTRAST
TECHNIQUE: Multidetector CT imaging of the abdomen and pelvis was performed
using the standard protocol following bolus administration of
intravenous contrast.

[Series 2: routine abd/pel with · axial · 0.78mm/px · z∈[-935,-515]mm · 13 of 94 slices shown, 15 images]
[im 5/94  soft-tissue]
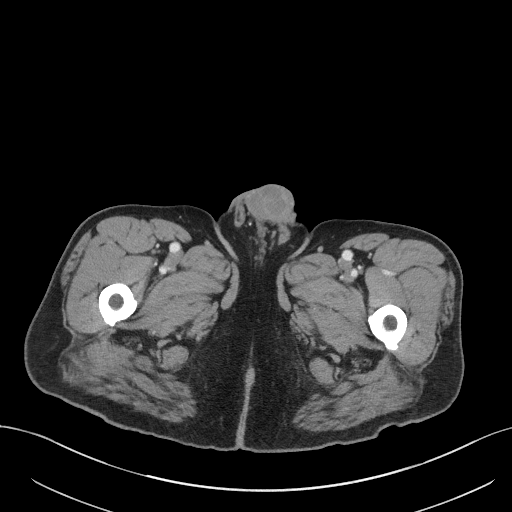
[im 5/94  bone]
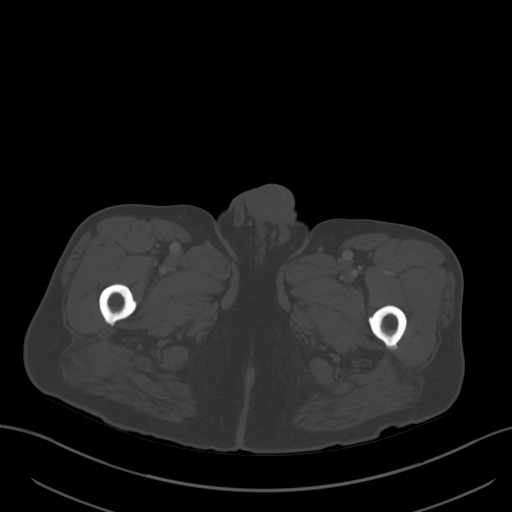
[im 15/94  soft-tissue]
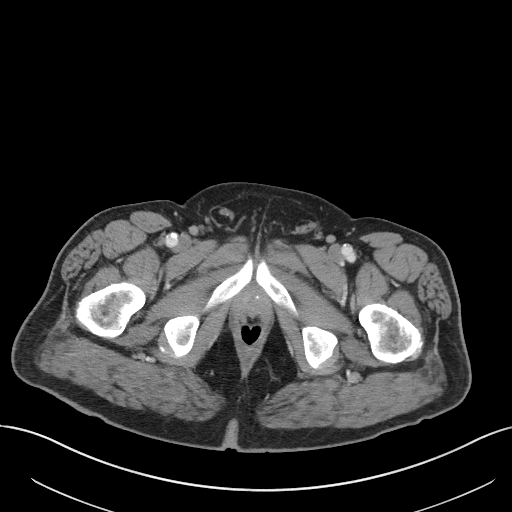
[im 20/94  soft-tissue]
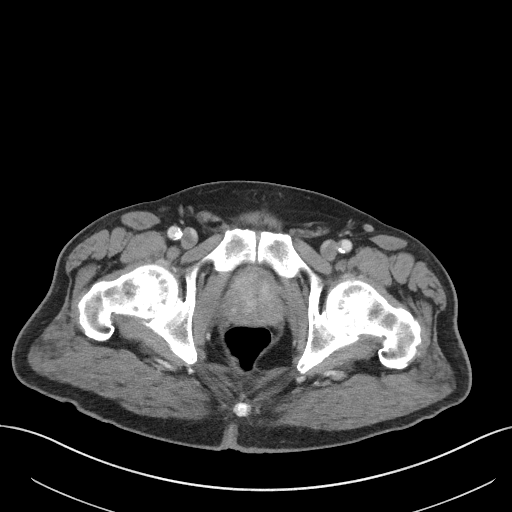
[im 25/94  soft-tissue]
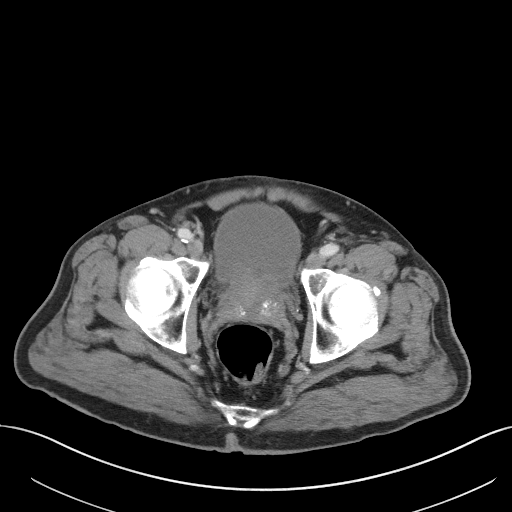
[im 35/94  soft-tissue]
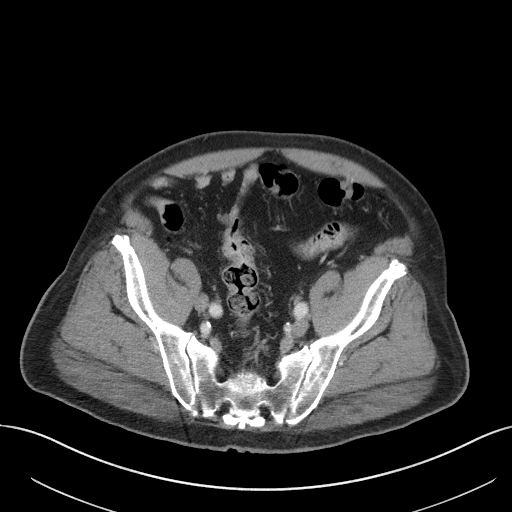
[im 40/94  soft-tissue]
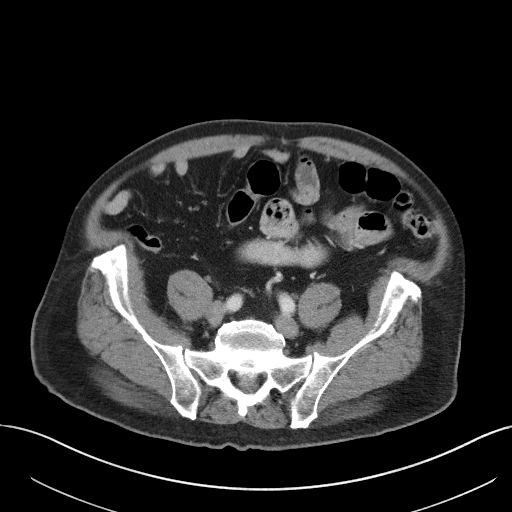
[im 49/94  soft-tissue]
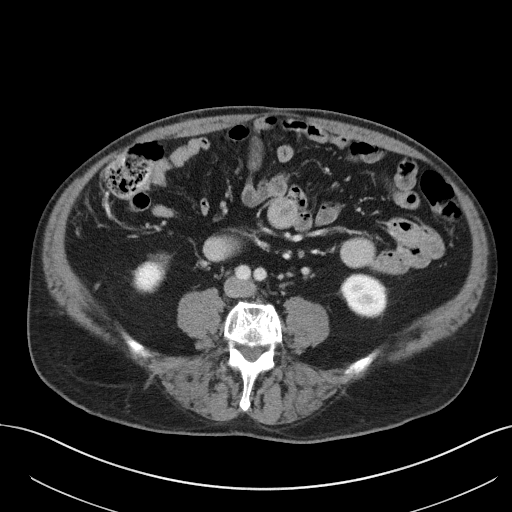
[im 54/94  soft-tissue]
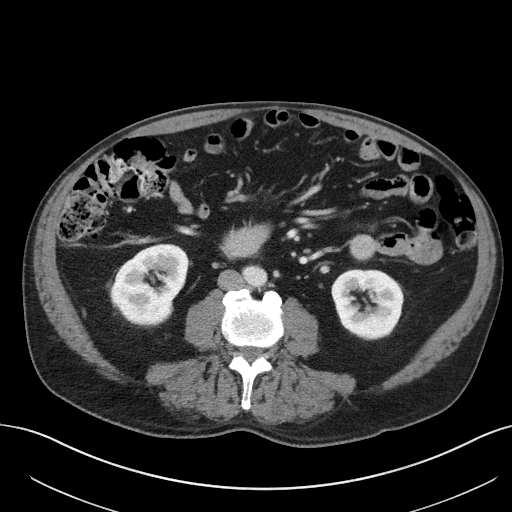
[im 59/94  soft-tissue]
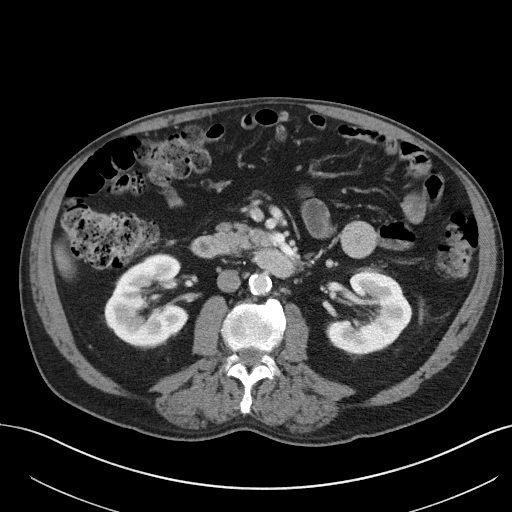
[im 59/94  bone]
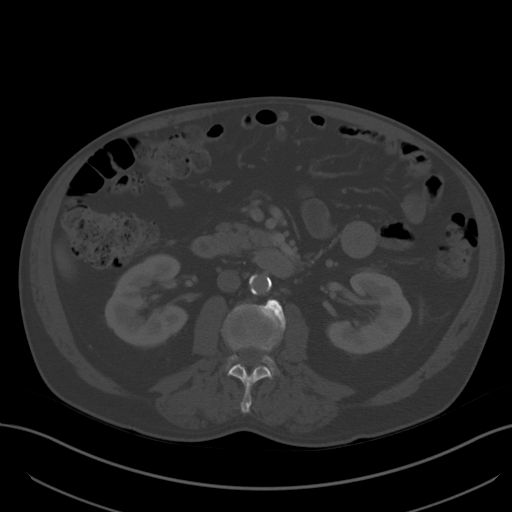
[im 69/94  soft-tissue]
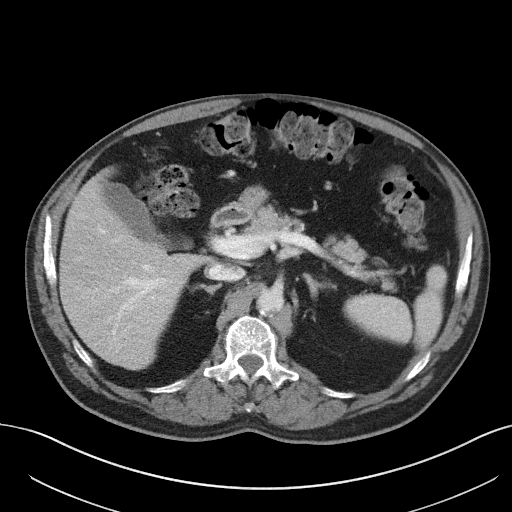
[im 74/94  soft-tissue]
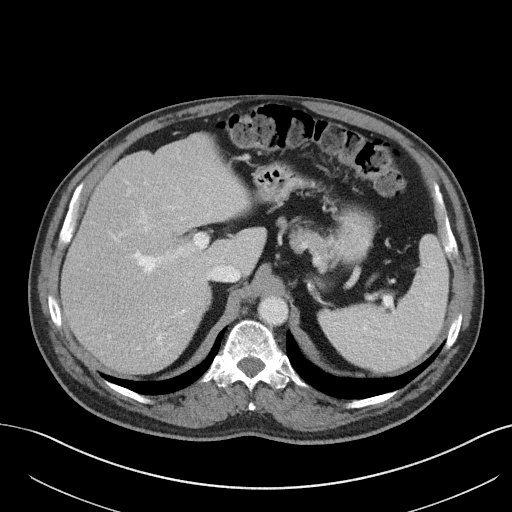
[im 79/94  soft-tissue]
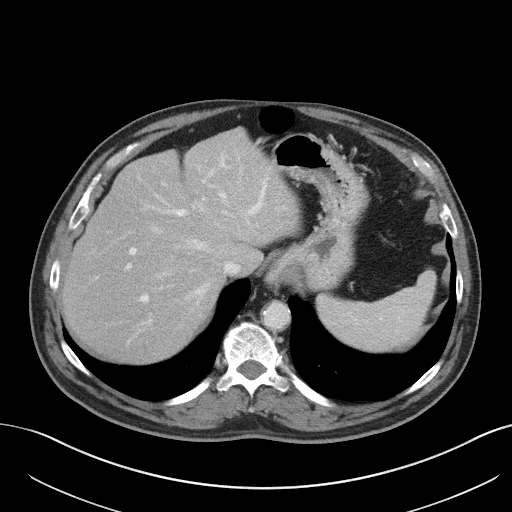
[im 89/94  soft-tissue]
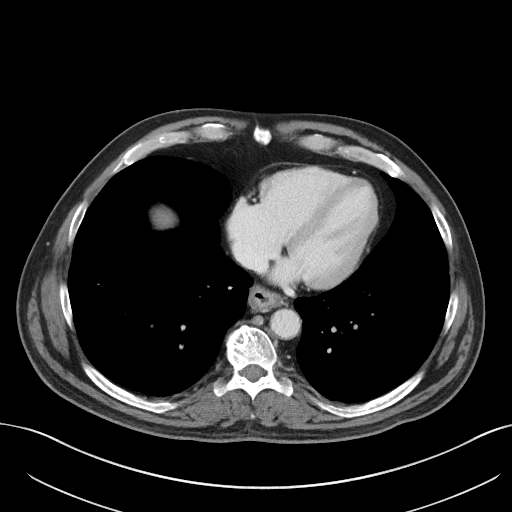

[Series 5: coronal st · coronal · 0.80mm/px · 3 of 96 slices shown]
[im 32/96  soft-tissue]
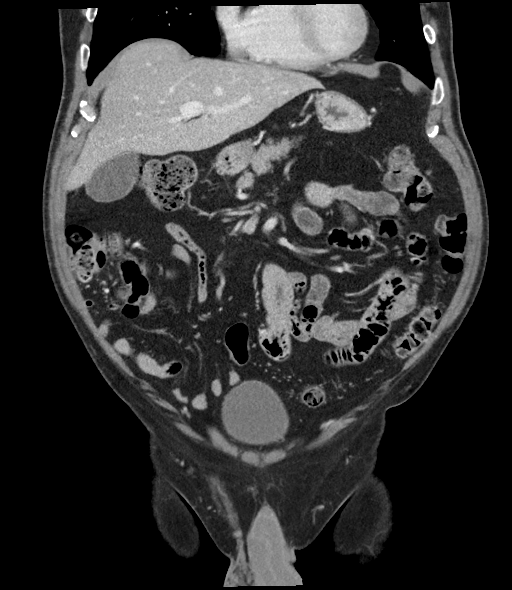
[im 43/96  soft-tissue]
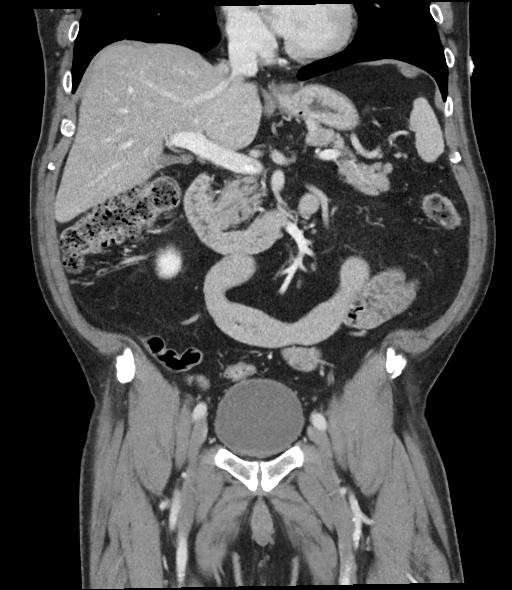
[im 53/96  soft-tissue]
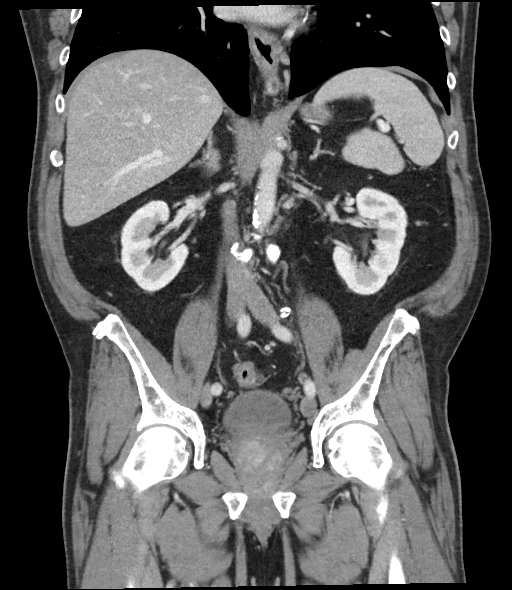

[16 of 46 positions shown; findings below may reference images not displayed]

RADIATION DOSE REDUCTION: This exam was performed according to the
departmental dose-optimization program which includes automated
exposure control, adjustment of the mA and/or kV according to
patient size and/or use of iterative reconstruction technique.

CONTRAST:  100mL OMNIPAQUE IOHEXOL 300 MG/ML  SOLN
FINDINGS: Lower chest: No acute pleural or parenchymal lung disease.

Hepatobiliary: No focal liver abnormality is seen. No gallstones,
gallbladder wall thickening, or biliary dilatation.

Pancreas: Unremarkable. No pancreatic ductal dilatation or
surrounding inflammatory changes.

Spleen: Normal in size without focal abnormality.

Adrenals/Urinary Tract: Adrenal glands are unremarkable. Kidneys are
normal, without renal calculi, focal lesion, or hydronephrosis.
Bladder is unremarkable.

Stomach/Bowel: No bowel obstruction or ileus. Normal appendix right
lower quadrant. Nonspecific wall thickening of the proximal jejunum
could be due to nondistended state versus underlying enteritis. No
inflammatory changes. Small hiatal hernia.

Vascular/Lymphatic: Aortic atherosclerosis. No enlarged abdominal or
pelvic lymph nodes.

Reproductive: Stable heterogeneous enlargement of the prostate.

Other: No free fluid or free intraperitoneal gas. No abdominal wall
hernia.

Musculoskeletal: No acute or destructive bony lesions. Reconstructed
images demonstrate no additional findings.
IMPRESSION: 1. Small hiatal hernia.
2. Nonspecific wall thickening of the proximal jejunum, likely due
to nondistended state. Underlying enteritis could give a similar
appearance. No bowel obstruction or ileus.
3. Stable enlarged prostate.
4.  Aortic Atherosclerosis ([id number]).
